# Patient Record
Sex: Female | Born: 1990 | Race: Black or African American | Hispanic: No | Marital: Single | Smoking: Never smoker
Health system: Southern US, Community
[De-identification: ages and names within clinical notes are randomized; demographics above are authoritative.]

## PROBLEM LIST (undated history)

## (undated) ENCOUNTER — Inpatient Hospital Stay (HOSPITAL_COMMUNITY): Payer: Self-pay

## (undated) DIAGNOSIS — B977 Papillomavirus as the cause of diseases classified elsewhere: Secondary | ICD-10-CM

## (undated) DIAGNOSIS — B0089 Other herpesviral infection: Secondary | ICD-10-CM

## (undated) DIAGNOSIS — R87629 Unspecified abnormal cytological findings in specimens from vagina: Secondary | ICD-10-CM

## (undated) DIAGNOSIS — IMO0002 Reserved for concepts with insufficient information to code with codable children: Secondary | ICD-10-CM

## (undated) DIAGNOSIS — B009 Herpesviral infection, unspecified: Secondary | ICD-10-CM

## (undated) DIAGNOSIS — D649 Anemia, unspecified: Secondary | ICD-10-CM

## (undated) HISTORY — DX: Other herpesviral infection: B00.89

## (undated) HISTORY — DX: Anemia, unspecified: D64.9

## (undated) HISTORY — DX: Papillomavirus as the cause of diseases classified elsewhere: B97.7

## (undated) HISTORY — DX: Herpesviral infection, unspecified: B00.9

## (undated) HISTORY — PX: LEEP: SHX91

## (undated) HISTORY — DX: Reserved for concepts with insufficient information to code with codable children: IMO0002

---

## 2011-03-14 ENCOUNTER — Ambulatory Visit (INDEPENDENT_AMBULATORY_CARE_PROVIDER_SITE_OTHER): Payer: Self-pay | Admitting: Obstetrics & Gynecology

## 2011-03-14 ENCOUNTER — Encounter: Payer: Self-pay | Admitting: Obstetrics & Gynecology

## 2011-03-14 DIAGNOSIS — N871 Moderate cervical dysplasia: Secondary | ICD-10-CM

## 2011-03-14 NOTE — Progress Notes (Signed)
20 y.o.G1P0010 No LMP recorded. Patient has had an injection. 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 I discussed cryotherapy vs. LEEP and she accepts LEEP. She will view the video today and she will be scheduled. Her questions were answered.  ARNOLD,JAMES 03/14/11

## 2011-03-14 NOTE — Patient Instructions (Signed)
Loop Electrosurgical Excision Procedure Loop electrosurgical excision procedure (LEEP) is the removal of a portion of the lower part of the uterus (cervix). The procedure is done when there are significantly abnormal cervical cell changes. Abnormal cell changes of the cervix can lead to cancer if left in place and untreated.  The LEEP procedure itself typically only takes a few minutes. Often, it may be done in your caregiver's office. The procedure is considered safe for those who wish to get pregnant or are trying to get pregnant. Only under rare circumstances should this procedure be done if you are pregnant. LET YOUR CAREGIVER KNOW ABOUT:  Whether you are pregnant or late for your last menstrual period.   Allergies to foods or medicines.   All the medicines you are taking includingherbs, eyedrops, and over-the-counter medicines, and creams.   Use of steroids (by mouth or creams).   Previous problems with anesthetics or numbing medicine.   Previous gynecological surgery.   History of blood clots or bleeding problems.   Any recent or current vaginal infections (herpes, sexually transmitted infections).   Other health problems.  RISKS AND COMPLICATIONS  Bleeding.   Infection.   Injury to the vagina, bladder, or rectum.   Very rare obstruction of the cervical opening that causes problems during menstruation (cervical stenosis).  BEFORE THE PROCEDURE  Do not take aspirin or blood thinners (anticoagulants) for 1 week before the procedure, or as told by your caregiver.   Eat a light meal before the procedure.   Ask your caregiver about changing or stopping your regular medicines.   You may be given a pain reliever 1 or 2 hours before the procedure.  PROCEDURE   A tool (speculum) is placed in the vagina. This allows your caregiver to see the cervix.   An iodine stain is applied to the cervix to find the area of abnormal cells to be removed.   Medicine is injected to numb  the cervix (local anesthetic).    Electricity is passed through a thin wire loop which is then used to remove (cauterize) a small segment of the affected cervix.   Light electrocautery is used to seal any small blood vessels and prevent bleeding.   A paste may be applied to the cauterized area of the cervix to help prevent bleeding.   The tissue sample is sent to the lab. It is examined under the microscope.  AFTER THE PROCEDURE  Have someone drive you home.   You may have slight to moderate cramping.   You may notice a black vaginal discharge from the paste used on the cervix to prevent bleeding. This is normal.   Watch for excessive bleeding. This requires immediate medical care.   Ask when your test results will be ready. Make sure you get your test results.  Document Released: 04/07/2002 Document Revised: 09/27/2010 Document Reviewed: 06/27/2010 [employer] Patient Information 2012 [employer]. 

## 2011-04-16 ENCOUNTER — Ambulatory Visit (INDEPENDENT_AMBULATORY_CARE_PROVIDER_SITE_OTHER): Payer: Self-pay | Admitting: Family Medicine

## 2011-04-16 ENCOUNTER — Encounter: Payer: Self-pay | Admitting: Family Medicine

## 2011-04-16 ENCOUNTER — Other Ambulatory Visit (HOSPITAL_COMMUNITY)
Admission: RE | Admit: 2011-04-16 | Discharge: 2011-04-16 | Disposition: A | Discharge status: Home / Self Care | Payer: Self-pay | Source: Ambulatory Visit | Attending: Family Medicine | Admitting: Family Medicine

## 2011-04-16 VITALS — BP 139/87 | HR 103 | Temp 97.4°F | Ht <= 58 in | Wt 105.8 lb

## 2011-04-16 DIAGNOSIS — N871 Moderate cervical dysplasia: Secondary | ICD-10-CM | POA: Insufficient documentation

## 2011-04-16 DIAGNOSIS — Z01812 Encounter for preprocedural laboratory examination: Secondary | ICD-10-CM

## 2011-04-16 LAB — POCT PREGNANCY, URINE: Preg Test, Ur: NEGATIVE

## 2011-04-16 NOTE — Progress Notes (Signed)
  LEEP PROCEDURE NOTE Pap smear and colposcopy reviewed.   Pap ASCUS - HPV+ Colpo Biopsy CIN2 ECC Neg Risks, benefits, alternatives, and limitations of procedure explained to patient, including pain, bleeding, infection, failure to remove abnormal tissue and failure to cure dysplasia, need for repeat procedures, damage to pelvic organs, cervical incompetence.  Role of HPV,cervical dysplasia and need for close followup was empasized. Informed written consent was obtained. All questions were answered. Time out performed.  Procedure: The patient was placed in lithotomy position and the bivalved coated speculum was placed in the patient's vagina. A grounding pad placed on the patient. Lugol's solution was applied to the cervix and areas of decreased uptake were noted 6 o'clock.   Local anesthesia was administered via an intracervical block using 10cc of 2% Lidocaine with epinephrine. The suction was turned on and the Small 1X Fisher Cone Biopsy Excisor on 41 Watts of cutting current was used to excise the area of decreased uptake and excise the entire transformation zone. Excellent hemostasis was achieved using roller ball coagulation set at 50 Watts coagulation current. Monsel's solution was then applied and the speculum was removed from the vagina. Specimens were sent to pathology. The patient tolerated the procedure well. Post-operative instructions given to patient, including instruction to seek medical attention for persistent bright red bleeding, fever, abdominal/pelvic pain, dysuria, nausea or vomiting. She was also told about the possibility of having copious yellow to black tinged discharge. She was counseled to avoid anything in the vagina (sex/douching/tampons) for 4-6 weeks. She has a  4-week post-operative check to review results and assess wound healing. Follow up in 6 months for repeat pap or as needed.

## 2011-04-16 NOTE — Patient Instructions (Signed)
Loop Electrosurgical Excision Procedure Care After Refer to this sheet in the next few weeks. These instructions provide you with information on caring for yourself after your procedure. Your caregiver may also give you more specific instructions. Your treatment has been planned according to current medical practices, but problems sometimes occur. Call your caregiver if you have any problems or questions after your procedure. HOME CARE INSTRUCTIONS   Do not use tampons, douche, or have sexual intercourse for 2 weeks or as directed by your caregiver.   Begin normal activities if you have no or minimal cramping or bleeding, unless directed otherwise by your caregiver.   Take your temperature if you feel sick. Write down your temperature on paper, and tell your caregiver if you have a fever.   Take all medicines as directed by your caregiver.   Keep all your follow-up appointments and Pap tests as directed by your caregiver.  SEEK IMMEDIATE MEDICAL CARE IF:   You have bleeding that is heavier or longer than a normal menstrual cycle.   You have bleeding that is bright red.   You have blood clots.   You have a fever.   You have increasing cramps or pain not relieved by medicine.   You develop abdominal pain that does not seem to be related to the same area of earlier cramping and pain.   You are lightheaded, unusually weak, or faint.   You develop painful or bloody urination.   You develop a bad smelling vaginal discharge.  MAKE SURE YOU:  Understand these instructions.   Will watch your condition.   Will get help right away if you are not doing well or get worse.  Document Released: 09/28/2010 Document Revised: 01/04/2011 Document Reviewed: 09/28/2010 [employer] Patient Information 2012 [employer]. 

## 2011-05-16 ENCOUNTER — Ambulatory Visit (INDEPENDENT_AMBULATORY_CARE_PROVIDER_SITE_OTHER): Payer: Self-pay | Admitting: Family Medicine

## 2011-05-16 VITALS — BP 128/88 | HR 97 | Temp 99.8°F | Ht <= 58 in | Wt 102.0 lb

## 2011-05-16 DIAGNOSIS — N871 Moderate cervical dysplasia: Secondary | ICD-10-CM

## 2011-05-16 NOTE — Progress Notes (Signed)
  Subjective:    Patient ID: Mary Bray, female    DOB: [birth year], 21 y.o.   MRN: [id number]  HPI Patient seen for follow up of LEEP - having no discharge, discomfort, etc.  Having no other problems.  Review of Systems     Objective:   Physical Exam  Constitutional: She is oriented to person, place, and time. She appears well-developed and well-nourished.  Neurological: She is alert and oriented to person, place, and time.  Psychiatric: She has a normal mood and affect. Her behavior is normal. Judgment and thought content normal.      Assessment & Plan:  1.  CIN 2 Follow up with PAP at 6 and 12 months.

## 2011-05-16 NOTE — Patient Instructions (Signed)
The biopsy (LEEP) results show that we removed all of the abnormal cells. You will need a PAP smear at 6 months (Sept 2013) and 12 months (March 2014)

## 2012-04-11 ENCOUNTER — Encounter (HOSPITAL_COMMUNITY): Payer: Self-pay | Admitting: Emergency Medicine

## 2012-04-11 ENCOUNTER — Emergency Department (HOSPITAL_COMMUNITY)
Admission: EM | Admit: 2012-04-11 | Discharge: 2012-04-11 | Disposition: A | Discharge status: Home / Self Care | Payer: No Typology Code available for payment source | Attending: Emergency Medicine | Admitting: Emergency Medicine

## 2012-04-11 DIAGNOSIS — S7010XA Contusion of unspecified thigh, initial encounter: Secondary | ICD-10-CM | POA: Insufficient documentation

## 2012-04-11 DIAGNOSIS — S60222A Contusion of left hand, initial encounter: Secondary | ICD-10-CM

## 2012-04-11 DIAGNOSIS — S60229A Contusion of unspecified hand, initial encounter: Secondary | ICD-10-CM | POA: Insufficient documentation

## 2012-04-11 DIAGNOSIS — Y9389 Activity, other specified: Secondary | ICD-10-CM | POA: Insufficient documentation

## 2012-04-11 DIAGNOSIS — S0003XA Contusion of scalp, initial encounter: Secondary | ICD-10-CM | POA: Insufficient documentation

## 2012-04-11 DIAGNOSIS — S0083XA Contusion of other part of head, initial encounter: Secondary | ICD-10-CM

## 2012-04-11 DIAGNOSIS — Y92410 Unspecified street and highway as the place of occurrence of the external cause: Secondary | ICD-10-CM | POA: Insufficient documentation

## 2012-04-11 DIAGNOSIS — S335XXA Sprain of ligaments of lumbar spine, initial encounter: Secondary | ICD-10-CM | POA: Insufficient documentation

## 2012-04-11 DIAGNOSIS — S7012XA Contusion of left thigh, initial encounter: Secondary | ICD-10-CM

## 2012-04-11 DIAGNOSIS — S39012A Strain of muscle, fascia and tendon of lower back, initial encounter: Secondary | ICD-10-CM

## 2012-04-11 MED ORDER — CYCLOBENZAPRINE HCL 5 MG PO TABS: 5.0000 mg | ORAL_TABLET | Freq: Three times a day (TID) | ORAL | Status: DC | PRN

## 2012-04-11 MED ORDER — CYCLOBENZAPRINE HCL 10 MG PO TABS
5.0000 mg | ORAL_TABLET | Freq: Once | ORAL | Status: AC
  Administered 2012-04-11: 5 mg via ORAL
  Filled 2012-04-11: qty 1

## 2012-04-11 MED ORDER — ACETAMINOPHEN-CODEINE #3 300-30 MG PO TABS: 1.0000 | ORAL_TABLET | Freq: Four times a day (QID) | ORAL | Status: DC | PRN

## 2012-04-11 NOTE — Discharge Instructions (Signed)
 Contusion A contusion is a deep bruise. Contusions are the result of an injury that caused bleeding under the skin. The contusion may turn blue, purple, or yellow. Minor injuries will give you a painless contusion, but more severe contusions may stay painful and swollen for a few weeks.  CAUSES  A contusion is usually caused by a blow, trauma, or direct force to an area of the body. SYMPTOMS   Swelling and redness of the injured area.  Bruising of the injured area.  Tenderness and soreness of the injured area.  Pain. DIAGNOSIS  The diagnosis can be made by taking a history and physical exam. An X-ray, CT scan, or MRI may be needed to determine if there were any associated injuries, such as fractures. TREATMENT  Specific treatment will depend on what area of the body was injured. In general, the best treatment for a contusion is resting, icing, elevating, and applying cold compresses to the injured area. Over-the-counter medicines may also be recommended for pain control. Ask your caregiver what the best treatment is for your contusion. HOME CARE INSTRUCTIONS   Put ice on the injured area.  Put ice in a plastic bag.  Place a towel between your skin and the bag.  Leave the ice on for 15 to 20 minutes, 3 to 4 times a day.  Only take over-the-counter or prescription medicines for pain, discomfort, or fever as directed by your caregiver. Your caregiver may recommend avoiding anti-inflammatory medicines (aspirin, ibuprofen , and naproxen) for 48 hours because these medicines may increase bruising.  Rest the injured area.  If possible, elevate the injured area to reduce swelling. SEEK IMMEDIATE MEDICAL CARE IF:   You have increased bruising or swelling.  You have pain that is getting worse.  Your swelling or pain is not relieved with medicines. MAKE SURE YOU:   Understand these instructions.  Will watch your condition.  Will get help right away if you are not doing well or get  worse. Document Released: 10/25/2004 Document Revised: 04/09/2011 Document Reviewed: 11/20/2010 [facility] Patient Information 2013 [place], [place].   Narcotic and benzodiazepine use may cause drowsiness, slowed breathing or dependence.  Please use with caution and do not drive, operate machinery or watch young children alone while taking them.  Taking combinations of these medications or drinking alcohol will potentiate these effects.
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
## 2012-04-11 NOTE — ED Notes (Signed)
AIDET performed. 

## 2013-11-30 ENCOUNTER — Encounter (HOSPITAL_COMMUNITY): Payer: Self-pay | Admitting: Emergency Medicine

## 2016-08-27 ENCOUNTER — Ambulatory Visit (INDEPENDENT_AMBULATORY_CARE_PROVIDER_SITE_OTHER): Payer: Self-pay | Admitting: Orthopaedic Surgery

## 2017-04-13 ENCOUNTER — Emergency Department (HOSPITAL_COMMUNITY): Payer: Medicaid Other

## 2017-04-13 ENCOUNTER — Emergency Department (HOSPITAL_COMMUNITY)
Admission: EM | Admit: 2017-04-13 | Discharge: 2017-04-13 | Disposition: A | Discharge status: Home / Self Care | Payer: Medicaid Other | Attending: Physician Assistant | Admitting: Physician Assistant

## 2017-04-13 ENCOUNTER — Encounter (HOSPITAL_COMMUNITY): Payer: Self-pay

## 2017-04-13 ENCOUNTER — Other Ambulatory Visit: Payer: Self-pay

## 2017-04-13 DIAGNOSIS — Z041 Encounter for examination and observation following transport accident: Secondary | ICD-10-CM | POA: Diagnosis not present

## 2017-04-13 LAB — HCG, QUANTITATIVE, PREGNANCY: HCG, BETA CHAIN, QUANT, S: 1915 m[IU]/mL — AB (ref <5)

## 2017-04-13 NOTE — ED Notes (Signed)
From ul

## 2017-04-13 NOTE — Discharge Instructions (Signed)
Follow-up with OB/GYN as needed for prenatal care or any other concerns about your pregnancy. Return to the emergency room if you develop any new or concerning symptoms.
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
## 2017-04-13 NOTE — ED Notes (Signed)
To ultrasound

## 2017-05-06 ENCOUNTER — Inpatient Hospital Stay (HOSPITAL_COMMUNITY)
Admission: AD | Admit: 2017-05-06 | Discharge: 2017-05-07 | Disposition: A | Discharge status: Home / Self Care | Payer: Medicaid Other | Source: Ambulatory Visit | Attending: Obstetrics and Gynecology | Admitting: Obstetrics and Gynecology

## 2017-05-06 ENCOUNTER — Encounter (HOSPITAL_COMMUNITY): Payer: Self-pay

## 2017-05-06 DIAGNOSIS — Z3A01 Less than 8 weeks gestation of pregnancy: Secondary | ICD-10-CM | POA: Insufficient documentation

## 2017-05-06 DIAGNOSIS — O26851 Spotting complicating pregnancy, first trimester: Secondary | ICD-10-CM | POA: Insufficient documentation

## 2017-05-06 DIAGNOSIS — O209 Hemorrhage in early pregnancy, unspecified: Secondary | ICD-10-CM

## 2017-05-06 HISTORY — DX: Unspecified abnormal cytological findings in specimens from vagina: R87.629

## 2017-05-06 NOTE — MAU Note (Signed)
Pt here with spotting that started tonight. Had intercourse last night. Denies pain.

## 2017-05-07 ENCOUNTER — Encounter (HOSPITAL_COMMUNITY): Payer: Self-pay | Admitting: *Deleted

## 2017-05-07 ENCOUNTER — Inpatient Hospital Stay (HOSPITAL_COMMUNITY): Payer: Medicaid Other

## 2017-05-07 LAB — URINALYSIS, ROUTINE W REFLEX MICROSCOPIC
Bilirubin Urine: NEGATIVE
Glucose, UA: NEGATIVE mg/dL
Hgb urine dipstick: NEGATIVE
Ketones, ur: NEGATIVE mg/dL
Leukocytes, UA: NEGATIVE
Nitrite: NEGATIVE
Protein, ur: NEGATIVE mg/dL
Specific Gravity, Urine: 1.031 — ABNORMAL HIGH (ref 1.005–1.030)
pH: 5 (ref 5.0–8.0)

## 2017-05-07 LAB — WET PREP, GENITAL
Sperm: NONE SEEN
Trich, Wet Prep: NONE SEEN
Yeast Wet Prep HPF POC: NONE SEEN

## 2017-05-07 NOTE — MAU Note (Signed)
Explained delay to pt.  She said she has to get home so she can sleep before work . Offered to provide her with a work note but she said she is not to use a note, she has to work . L Clemmons CNM on her way to evaluate pt.

## 2017-05-07 NOTE — MAU Note (Signed)
Pt states she voided around 2200 and noticed pink spotting and got nervous and decided to come in to be checked.

## 2017-05-07 NOTE — MAU Provider Note (Signed)
History     CSN: [id number]  Arrival date and time: 05/06/17 2212   None     Chief Complaint  Patient presents with  . Vaginal Bleeding   Pt states she voided around 2200 and noticed pink spotting and got nervous and decided to come in to be checked. Explained delay to pt.  She said she has to get home so she can sleep before work . Offered to provide her with a work note but she said she is not to use a note, she has to work . L Clemmons CNM on her way to evaluate pt.     Past Medical History:  Diagnosis Date  . Abnormal Pap smear   . Vaginal Pap smear, abnormal     Past Surgical History:  Procedure Laterality Date  . LEEP      History reviewed. No pertinent family history.  Social History   Tobacco Use  . Smoking status: Never Smoker  . Smokeless tobacco: Never Used  Substance Use Topics  . Alcohol use: Yes    Comment: not while preg  . Drug use: No    Allergies: No Known Allergies  Medications Prior to Admission  Medication Sig Dispense Refill Last Dose  . Prenatal Vit-Fe Fumarate-FA (MULTIVITAMIN-PRENATAL) 27-0.8 MG TABS tablet Take 1 tablet by mouth daily at 12 noon.   05/06/2017 at Unknown time    Review of Systems  Genitourinary:       Pink spotting after sex   Physical Exam   Blood pressure 132/72, pulse 96, temperature 98.5 F (36.9 C), temperature source Oral, resp. rate 18, height 4\' 9"  (1.448 m), weight 103 lb (46.7 kg), SpO2 100 %. Results for orders placed or performed during the hospital encounter of 05/06/17 (from the past 24 hour(s))  Urinalysis, Routine w reflex microscopic     Status: Abnormal   Collection Time: 05/06/17 11:25 PM  Result Value Ref Range   Color, Urine YELLOW YELLOW   APPearance HAZY (A) CLEAR   Specific Gravity, Urine 1.031 (H) 1.005 - 1.030   pH 5.0 5.0 - 8.0   Glucose, UA NEGATIVE NEGATIVE mg/dL   Hgb urine dipstick NEGATIVE NEGATIVE   Bilirubin Urine NEGATIVE NEGATIVE   Ketones, ur NEGATIVE NEGATIVE mg/dL   Protein, ur NEGATIVE NEGATIVE mg/dL   Nitrite NEGATIVE NEGATIVE   Leukocytes, UA NEGATIVE NEGATIVE  Wet prep, genital     Status: Abnormal   Collection Time: 05/07/17 12:43 AM  Result Value Ref Range   Yeast Wet Prep HPF POC NONE SEEN NONE SEEN   Trich, Wet Prep NONE SEEN NONE SEEN   Clue Cells Wet Prep HPF POC PRESENT (A) NONE SEEN   WBC, Wet Prep HPF POC FEW (A) NONE SEEN   Sperm NONE SEEN    Physical Exam  Nursing note and vitals reviewed. Constitutional: She is oriented to person, place, and time. She appears well-developed.  HENT:  Head: Normocephalic.  Cardiovascular: Normal rate.  Respiratory: Effort normal.  GI: Soft.  Musculoskeletal: Normal range of motion.  Neurological: She is alert and oriented to person, place, and time.  Psychiatric: She has a normal mood and affect.   [place] Ob Transvaginal  Result Date: 05/07/2017 CLINICAL DATA:  Pregnant patient in first-trimester pregnancy with vaginal bleeding. EXAM: OBSTETRIC <14 WK ULTRASOUND TECHNIQUE: Transabdominal ultrasound was performed for evaluation of the gestation as well as the maternal uterus and adnexal regions. COMPARISON:  Obstetric ultrasound 04/13/2017 FINDINGS: Intrauterine gestational sac: Single Yolk sac:  Not Visualized. Embryo:  Not Visualized. Cardiac Activity: Not Visualized. MSD: No 0.5 mm   5 w   5 d Subchorionic hemorrhage:  None visualized. Maternal uterus/adnexae: Gestational sac in the endometrial canal. Yolk sac on prior exam is not currently visualized. Some internal debris in the gestational sac. The previous ovarian cysts have resolved, the ovaries are currently normal in size. No pelvic free fluid. IMPRESSION: 1. Intrauterine gestational sac, the yolk sac seen previously is no longer visualized. Absence of an embryo greater than 10 days after scan demonstrating a gestational sac and yolk sac. Findings meet definitive criteria for failed pregnancy. This follows SRU consensus guidelines: Diagnostic Criteria  for Nonviable Pregnancy Early in the First Trimester. Macy Mis J Med [id number]. 2. Previous ovarian cysts have resolved. Electronically Signed   By: Rubye Oaks M.D.   On: 05/07/2017 02:30   MAU Course  Procedures  MDM Pt sent to Ultrasound and then stated she could not wait for results' She wants to be called with results" to make sure her baby is ok" Pt is dressed and leaving. Declines further treatment  Assessment and Plan  First Trimester Spotting  Pt left without results; NO PHYSICAL compalints when she left.  05/07/17 908pm- Called pt and informed her of u/s results. She has appointment with Henreitta Leber tomorrow. Encouraged pt to keep ap[pointment and explained we would need to follow beta HCG's. Pt states she understands but was upset and crying on the phone.  Lori A Clemmons 05/07/2017, 1:57 AM

## 2017-10-27 ENCOUNTER — Encounter (HOSPITAL_COMMUNITY): Payer: Self-pay | Admitting: Emergency Medicine

## 2017-10-27 ENCOUNTER — Emergency Department (HOSPITAL_COMMUNITY)
Admission: EM | Admit: 2017-10-27 | Discharge: 2017-10-27 | Disposition: A | Discharge status: Home / Self Care | Payer: BLUE CROSS/BLUE SHIELD | Attending: Emergency Medicine | Admitting: Emergency Medicine

## 2017-10-27 ENCOUNTER — Other Ambulatory Visit: Payer: Self-pay

## 2017-10-27 ENCOUNTER — Emergency Department (HOSPITAL_COMMUNITY): Payer: BLUE CROSS/BLUE SHIELD

## 2017-10-27 DIAGNOSIS — S161XXA Strain of muscle, fascia and tendon at neck level, initial encounter: Secondary | ICD-10-CM | POA: Diagnosis not present

## 2017-10-27 DIAGNOSIS — Y939 Activity, unspecified: Secondary | ICD-10-CM | POA: Insufficient documentation

## 2017-10-27 DIAGNOSIS — M542 Cervicalgia: Secondary | ICD-10-CM

## 2017-10-27 DIAGNOSIS — S199XXA Unspecified injury of neck, initial encounter: Secondary | ICD-10-CM | POA: Diagnosis present

## 2017-10-27 DIAGNOSIS — Y92410 Unspecified street and highway as the place of occurrence of the external cause: Secondary | ICD-10-CM | POA: Insufficient documentation

## 2017-10-27 DIAGNOSIS — Y998 Other external cause status: Secondary | ICD-10-CM | POA: Diagnosis not present

## 2017-10-27 LAB — POC URINE PREG, ED: PREG TEST UR: NEGATIVE

## 2017-10-27 MED ORDER — IBUPROFEN 600 MG PO TABS: 600.0000 mg | ORAL_TABLET | Freq: Four times a day (QID) | ORAL | 0 refills | Status: DC | PRN

## 2017-10-27 MED ORDER — METHOCARBAMOL 500 MG PO TABS: 500.0000 mg | ORAL_TABLET | Freq: Every evening | ORAL | 0 refills | Status: DC | PRN

## 2017-10-27 MED ORDER — IBUPROFEN 400 MG PO TABS
600.0000 mg | ORAL_TABLET | Freq: Once | ORAL | Status: AC
  Administered 2017-10-27: 23:00:00 600 mg via ORAL
  Filled 2017-10-27: qty 1

## 2017-10-27 MED ORDER — METHOCARBAMOL 500 MG PO TABS
500.0000 mg | ORAL_TABLET | Freq: Once | ORAL | Status: AC
  Administered 2017-10-27: 500 mg via ORAL
  Filled 2017-10-27: qty 1
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
## 2017-10-27 NOTE — ED Notes (Addendum)
Pt left before receiving discharge paperwork or discharge vitals could be completed. Also not able to review discharge instructions with pt.
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
## 2018-09-03 ENCOUNTER — Other Ambulatory Visit: Payer: Self-pay

## 2018-09-03 ENCOUNTER — Ambulatory Visit (INDEPENDENT_AMBULATORY_CARE_PROVIDER_SITE_OTHER): Payer: BLUE CROSS/BLUE SHIELD | Admitting: Registered Nurse

## 2018-09-03 ENCOUNTER — Encounter: Payer: Self-pay | Admitting: Registered Nurse

## 2018-09-03 VITALS — BP 127/84 | HR 61 | Temp 99.0°F | Resp 16 | Ht 60.83 in | Wt 99.4 lb

## 2018-09-03 DIAGNOSIS — Z13228 Encounter for screening for other metabolic disorders: Secondary | ICD-10-CM | POA: Diagnosis not present

## 2018-09-03 DIAGNOSIS — Z1329 Encounter for screening for other suspected endocrine disorder: Secondary | ICD-10-CM

## 2018-09-03 DIAGNOSIS — M545 Low back pain, unspecified: Secondary | ICD-10-CM

## 2018-09-03 DIAGNOSIS — Z13220 Encounter for screening for lipoid disorders: Secondary | ICD-10-CM

## 2018-09-03 DIAGNOSIS — Z130 Encounter for screening for diseases of the blood and blood-forming organs and certain disorders involving the immune mechanism: Secondary | ICD-10-CM

## 2018-09-03 DIAGNOSIS — G8929 Other chronic pain: Secondary | ICD-10-CM

## 2018-09-03 NOTE — Patient Instructions (Signed)
° ° ° °  If you have lab work done today you will be contacted with your lab results within the next 2 weeks.  If you have not heard from us then please contact us. The fastest way to get your results is to register for My Chart. ° ° °IF you received an x-ray today, you will receive an invoice from [facility]. Please contact [facility] at [phone] with questions or concerns regarding your invoice.  ° °IF you received labwork today, you will receive an invoice from [facility]. Please contact [facility] at [phone] with questions or concerns regarding your invoice.  ° °Our billing staff will not be able to assist you with questions regarding bills from these companies. ° °You will be contacted with the lab results as soon as they are available. The fastest way to get your results is to activate your My Chart account. Instructions are located on the last page of this paperwork. If you have not heard from us regarding the results in 2 weeks, please contact this office. °  ° ° ° °
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
## 2018-09-04 LAB — COMPREHENSIVE METABOLIC PANEL
ALT: 6 IU/L (ref 0–32)
AST: 16 IU/L (ref 0–40)
Albumin/Globulin Ratio: 1.6 (ref 1.2–2.2)
Albumin: 4.1 g/dL (ref 3.9–5.0)
Alkaline Phosphatase: 70 IU/L (ref 39–117)
BUN/Creatinine Ratio: 14 (ref 9–23)
BUN: 11 mg/dL (ref 6–20)
Bilirubin Total: 0.4 mg/dL (ref 0.0–1.2)
CO2: 23 mmol/L (ref 20–29)
Calcium: 9.3 mg/dL (ref 8.7–10.2)
Chloride: 105 mmol/L (ref 96–106)
Creatinine, Ser: 0.77 mg/dL (ref 0.57–1.00)
GFR calc Af Amer: 121 mL/min/{1.73_m2} (ref >59)
GFR calc non Af Amer: 105 mL/min/{1.73_m2} (ref >59)
Globulin, Total: 2.6 g/dL (ref 1.5–4.5)
Glucose: 83 mg/dL (ref 65–99)
Potassium: 3.8 mmol/L (ref 3.5–5.2)
Sodium: 142 mmol/L (ref 134–144)
Total Protein: 6.7 g/dL (ref 6.0–8.5)

## 2018-09-04 LAB — CBC WITH DIFFERENTIAL/PLATELET
Basophils Absolute: 0 10*3/uL (ref 0.0–0.2)
Basos: 1 %
EOS (ABSOLUTE): 0.1 10*3/uL (ref 0.0–0.4)
Eos: 2 %
Hematocrit: 38.5 % (ref 34.0–46.6)
Hemoglobin: 11.9 g/dL (ref 11.1–15.9)
Immature Grans (Abs): 0 10*3/uL (ref 0.0–0.1)
Immature Granulocytes: 0 %
Lymphocytes Absolute: 1.6 10*3/uL (ref 0.7–3.1)
Lymphs: 43 %
MCH: 27.5 pg (ref 26.6–33.0)
MCHC: 30.9 g/dL — ABNORMAL LOW (ref 31.5–35.7)
MCV: 89 fL (ref 79–97)
Monocytes Absolute: 0.3 10*3/uL (ref 0.1–0.9)
Monocytes: 8 %
Neutrophils Absolute: 1.7 10*3/uL (ref 1.4–7.0)
Neutrophils: 46 %
Platelets: 245 10*3/uL (ref 150–450)
RBC: 4.33 x10E6/uL (ref 3.77–5.28)
RDW: 13.1 % (ref 11.7–15.4)
WBC: 3.7 10*3/uL (ref 3.4–10.8)

## 2018-09-04 LAB — LIPID PANEL
Chol/HDL Ratio: 2.1 ratio (ref 0.0–4.4)
Cholesterol, Total: 133 mg/dL (ref 100–199)
HDL: 63 mg/dL (ref >39)
LDL Calculated: 59 mg/dL (ref 0–99)
Triglycerides: 54 mg/dL (ref 0–149)
VLDL Cholesterol Cal: 11 mg/dL (ref 5–40)

## 2018-09-04 LAB — TSH: TSH: 0.916 u[IU]/mL (ref 0.450–4.500)

## 2018-09-04 LAB — HEMOGLOBIN A1C
Est. average glucose Bld gHb Est-mCnc: 105 mg/dL
Hgb A1c MFr Bld: 5.3 % (ref 4.8–5.6)

## 2018-09-05 ENCOUNTER — Encounter: Payer: Self-pay | Admitting: Registered Nurse

## 2018-09-05 NOTE — Progress Notes (Signed)
Lab results normal except for mildly decreased MCHC, not concerning at this time.  Letter sent.  Kathrin Ruddy, NP

## 2018-09-12 ENCOUNTER — Emergency Department (HOSPITAL_COMMUNITY)
Admission: EM | Admit: 2018-09-12 | Discharge: 2018-09-12 | Disposition: A | Discharge status: Home / Self Care | Payer: BC Managed Care – PPO | Attending: Emergency Medicine | Admitting: Emergency Medicine

## 2018-09-12 ENCOUNTER — Other Ambulatory Visit: Payer: Self-pay

## 2018-09-12 ENCOUNTER — Encounter (HOSPITAL_COMMUNITY): Payer: Self-pay | Admitting: *Deleted

## 2018-09-12 DIAGNOSIS — K5900 Constipation, unspecified: Secondary | ICD-10-CM | POA: Diagnosis not present

## 2018-09-12 DIAGNOSIS — R1030 Lower abdominal pain, unspecified: Secondary | ICD-10-CM

## 2018-09-12 DIAGNOSIS — Z79899 Other long term (current) drug therapy: Secondary | ICD-10-CM | POA: Diagnosis not present

## 2018-09-12 LAB — COMPREHENSIVE METABOLIC PANEL
ALT: 10 U/L (ref 0–44)
AST: 19 U/L (ref 15–41)
Albumin: 4 g/dL (ref 3.5–5.0)
Alkaline Phosphatase: 77 U/L (ref 38–126)
Anion gap: 8 (ref 5–15)
BUN: 12 mg/dL (ref 6–20)
CO2: 26 mmol/L (ref 22–32)
Calcium: 9.5 mg/dL (ref 8.9–10.3)
Chloride: 106 mmol/L (ref 98–111)
Creatinine, Ser: 0.74 mg/dL (ref 0.44–1.00)
GFR calc Af Amer: >60 mL/min (ref >60)
GFR calc non Af Amer: >60 mL/min (ref >60)
Glucose, Bld: 96 mg/dL (ref 70–99)
Potassium: 3.6 mmol/L (ref 3.5–5.1)
Sodium: 140 mmol/L (ref 135–145)
Total Bilirubin: 0.7 mg/dL (ref 0.3–1.2)
Total Protein: 7.3 g/dL (ref 6.5–8.1)

## 2018-09-12 LAB — URINALYSIS, ROUTINE W REFLEX MICROSCOPIC
Bilirubin Urine: NEGATIVE
Glucose, UA: NEGATIVE mg/dL
Hgb urine dipstick: NEGATIVE
Ketones, ur: NEGATIVE mg/dL
Leukocytes,Ua: NEGATIVE
Nitrite: NEGATIVE
Protein, ur: NEGATIVE mg/dL
Specific Gravity, Urine: 1.025 (ref 1.005–1.030)
pH: 5 (ref 5.0–8.0)

## 2018-09-12 LAB — CBC
HCT: 37.9 % (ref 36.0–46.0)
Hemoglobin: 12.1 g/dL (ref 12.0–15.0)
MCH: 27.8 pg (ref 26.0–34.0)
MCHC: 31.9 g/dL (ref 30.0–36.0)
MCV: 86.9 fL (ref 80.0–100.0)
Platelets: 231 10*3/uL (ref 150–400)
RBC: 4.36 MIL/uL (ref 3.87–5.11)
RDW: 13 % (ref 11.5–15.5)
WBC: 6.4 10*3/uL (ref 4.0–10.5)
nRBC: 0 % (ref 0.0–0.2)

## 2018-09-12 LAB — POC URINE PREG, ED: Preg Test, Ur: NEGATIVE

## 2018-09-12 MED ORDER — POLYETHYLENE GLYCOL 3350 17 GM/SCOOP PO POWD: ORAL | 0 refills | Status: DC

## 2018-09-12 MED ORDER — SODIUM CHLORIDE 0.9% FLUSH: 3.0000 mL | Freq: Once | INTRAVENOUS | Status: DC

## 2018-09-12 MED ORDER — DICYCLOMINE HCL 20 MG PO TABS: 20.0000 mg | ORAL_TABLET | Freq: Two times a day (BID) | ORAL | 0 refills | Status: DC | PRN

## 2018-09-12 NOTE — ED Triage Notes (Signed)
Pt woke up with mid lower abd pain. No NVD. Pain is intermittent

## 2018-09-12 NOTE — ED Notes (Signed)
Patient verbalizes understanding of discharge instructions. Opportunity for questioning and answers were provided. Armband removed by staff, pt discharged from ED.  

## 2018-09-12 NOTE — Discharge Instructions (Signed)
Make sure you are staying well hydrated with water.  Increase the fibre in your diet to help with constipation.  Use miralax as needed for constipation.  Use bentyl as needed for abdominal cramping.  Follow up with your primary care doctor if your symptoms are not improving.  Return to the ER if you develop fevers, chills, vomiting, severe/persistent pain, inability to urinate, or with any new, worsening, or concerning symptoms.

## 2018-09-12 NOTE — ED Provider Notes (Signed)
[facility]   CSN: [id number] Arrival date & time: 09/12/18  [id number]     History   Chief Complaint Chief Complaint  Patient presents with  . Abdominal Pain    HPI Mary Bray is a 28 y.o. female presenting for evaluation of abdominal pain.  Patient states last night she started to develop abdominal pain.  When she woke up this morning, it was more severe, especially when going towards the bathroom.  Since she first woke up, pain has improved, but has not completely resolved.  Pain is of her lower abdomen, equal both sides and in the middle.  She has not taken anything for her pain including Tylenol or ibuprofen.  She denies fevers, chills, nausea, vomiting, urinary symptoms.  Patient states that she has not pooped in several days, she cannot remember the last time.  She normally goes several days in between bowel movements, sometimes needs to strain.  She states she has not had anything to eat since yesterday afternoon, during which she had some cheese sticks and sweet tea.  She denies vaginal discharge or vaginal bleeding.  Last period was 2 weeks ago, it was normal for her.  She denies sick contacts.     HPI  Past Medical History:  Diagnosis Date  . Abnormal Pap smear   . Vaginal Pap smear, abnormal     Patient Active Problem List   Diagnosis Date Noted  . CIN II (cervical intraepithelial neoplasia II) 03/14/2011    Past Surgical History:  Procedure Laterality Date  . LEEP       OB History    Gravida  2   Para      Term      Preterm      AB  1   Living  0     SAB      TAB  1   Ectopic      Multiple      Live Births               Home Medications    Prior to Admission medications   Medication Sig Start Date End Date Taking? Authorizing Provider  ibuprofen (ADVIL,MOTRIN) 600 MG tablet Take 1 tablet (600 mg total) by mouth every 6 (six) hours as needed. 10/27/17  Yes Maczis, Elmer SowMichael M, PA-C   Prenatal Vit-Fe Fumarate-FA (MULTIVITAMIN-PRENATAL) 27-0.8 MG TABS tablet Take 1 tablet by mouth daily at 12 noon.   Yes [provider]  valACYclovir (VALTREX) 500 MG tablet Take 500 mg by mouth daily as needed (for sore outbreaks).    Yes [provider]  dicyclomine (BENTYL) 20 MG tablet Take 1 tablet (20 mg total) by mouth 2 (two) times daily as needed for spasms. 09/12/18   Angely Dietz, PA-C  polyethylene glycol powder (MIRALAX) 17 GM/SCOOP powder Use 1-2 capfuls daily dissolved in 8 oz liquid as needed to encourage a bowel movement. 09/12/18   Shelbey Spindler, PA-C    Family History No family history on file.  Social History Social History   Tobacco Use  . Smoking status: Never Smoker  . Smokeless tobacco: Never Used  Substance Use Topics  . Alcohol use: Yes    Comment: occ  . Drug use: No     Allergies   Patient has no known allergies.   Review of Systems Review of Systems  Gastrointestinal: Positive for abdominal pain.  All other systems reviewed and are negative.    Physical Exam Updated  Vital Signs BP (!) 134/99 (BP Location: Right Arm)   Pulse 85   Temp 98.2 F (36.8 C) (Oral)   Resp 12   LMP 08/30/2018   SpO2 100%   Physical Exam Vitals signs and nursing note reviewed.  Constitutional:      General: She is not in acute distress.    Appearance: She is well-developed.     Comments: Sitting comfortably in the chair no acute distress  HENT:     Head: Normocephalic and atraumatic.  Eyes:     Conjunctiva/sclera: Conjunctivae normal.     Pupils: Pupils are equal, round, and reactive to light.  Neck:     Musculoskeletal: Normal range of motion and neck supple.  Cardiovascular:     Rate and Rhythm: Normal rate and regular rhythm.     Pulses: Normal pulses.  Pulmonary:     Effort: Pulmonary effort is normal. No respiratory distress.     Breath sounds: Normal breath sounds. No wheezing.  Abdominal:     General: There is no  distension.     Palpations: Abdomen is soft. There is no mass.     Tenderness: There is abdominal tenderness. There is no guarding or rebound.     Comments: Mild tenderness palpation of lower abdomen.  No tenderness palpation elsewhere in the abdomen. No rigidity, guarding, distention.  Negative rebound.  No signs of peritonitis.  Musculoskeletal: Normal range of motion.  Skin:    General: Skin is warm and dry.     Capillary Refill: Capillary refill takes less than 2 seconds.  Neurological:     Mental Status: She is alert and oriented to person, place, and time.      ED Treatments / Results  Labs (all labs ordered are listed, but only abnormal results are displayed) Labs Reviewed  URINALYSIS, ROUTINE W REFLEX MICROSCOPIC - Abnormal; Notable for the following components:      Result Value   APPearance HAZY (*)    All other components within normal limits  COMPREHENSIVE METABOLIC PANEL  CBC  POC URINE PREG, ED    EKG None  Radiology No results found.  Procedures Procedures (including critical care time)  Medications Ordered in ED Medications  sodium chloride flush (NS) 0.9 % injection 3 mL (has no administration in time range)     Initial Impression / Assessment and Plan / ED Course  I have reviewed the triage vital signs and the nursing notes.  Pertinent labs & imaging results that were available during my care of the patient were reviewed by me and considered in my medical decision making (see chart for details).        Patient presenting for evaluation of lower abdominal pain.  Physical examination, she appears nontoxic.  Pain has been improving without intervention.  Mild tenderness on exam.  Patient also reporting constipation.  No fevers, chills, nausea, vomiting, or other infectious symptoms.  No GU symptoms such as discharge.  Likely constipation versus viral illness.  Low suspicion for appendicitis, as pain is bilateral and without fevers or vomiting.  Low  suspicion for diverticulitis.  Low suspicion for torsion, TOA, PID.  Consider UTI due to lower abdominal pain.  Will obtain labs, urine, and reassess.  Labs reassuring.  No leukocytosis.  Kidney and liver function reassuring.  Electrolytes stable.  Urine without infection.  At this time, doubt intra-abdominal infection, perforation, or surgical abdomen.  I do not believe CT scan is necessary at this time.  Favor constipation.  Discussed  findings with patient.  Discussed symptomatic treatment with increased hydration, increased fiber intake, and MiraLAX as needed.  If pain is not improving with bowel movements, consider follow-up with PCP or in the ED.  Strict return precautions given.  At this time, patient appears safe for discharge.  Patient states she understands and agrees to plan.  Final Clinical Impressions(s) / ED Diagnoses   Final diagnoses:  Lower abdominal pain  Constipation, unspecified constipation type    ED Discharge Orders         Ordered    polyethylene glycol powder (MIRALAX) 17 GM/SCOOP powder     09/12/18 1003    dicyclomine (BENTYL) 20 MG tablet  2 times daily PRN     09/12/18 1003           Kele Withem, PA-C 09/12/18 1222    Terrilee FilesButler, Michael C, MD 09/13/18 [id number]

## 2018-09-29 ENCOUNTER — Ambulatory Visit: Payer: BLUE CROSS/BLUE SHIELD | Admitting: Emergency Medicine

## 2018-11-05 ENCOUNTER — Other Ambulatory Visit: Payer: Self-pay

## 2018-11-05 ENCOUNTER — Encounter: Payer: Self-pay | Admitting: Physical Therapy

## 2018-11-05 ENCOUNTER — Ambulatory Visit: Payer: BC Managed Care – PPO | Attending: Registered Nurse | Admitting: Physical Therapy

## 2018-11-05 DIAGNOSIS — G8929 Other chronic pain: Secondary | ICD-10-CM

## 2018-11-05 DIAGNOSIS — M545 Low back pain, unspecified: Secondary | ICD-10-CM
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
## 2018-11-05 NOTE — Addendum Note (Signed)
Addended by: Elsie Ra R on: 11/05/2018 11:25 AM   Modules accepted: Orders

## 2018-11-05 NOTE — Patient Instructions (Addendum)
TENS UNIT: This is helpful for muscle pain and spasm.   Search and Purchase a TENS 7000 2nd edition at www.tenspros.com. It should be less than $30.     TENS unit instructions: Do not shower or bathe with the unit on Turn the unit off before removing electrodes or batteries If the electrodes lose stickiness add a drop of water to the electrodes after they are disconnected from the unit and place on plastic sheet. If you continued to have difficulty, call the TENS unit company to purchase more electrodes. Do not apply lotion on the skin area prior to use. Make sure the skin is clean and dry as this will help prolong the life of the electrodes. After use, always check skin for unusual red areas, rash or other skin difficulties. If there are any skin problems, does not apply electrodes to the same area. Never remove the electrodes from the unit by pulling the wires. Do not use the TENS unit or electrodes other than as directed. Do not change electrode placement without consultating your therapist or physician. Keep 2 fingers with between each electrode. Wear time ratio is 2:1, on to off times.    For example on for 30 minutes off for 15 minutes and then on for 30 minutes off for 15 minutes  Access Code: [email or web address]  URL: [email or web address]  Date: 11/05/2018  Prepared by: Elsie Ra   Exercises  Supine Single Knee to Chest - 3 sets - 30 hold - 2x daily - 6x weekly  Supine Lower Trunk Rotation - 10 reps - 1-2 sets - 5 hold - 2x daily - 6x weekly  Supine Bridge - 10 reps - 1-2 sets - 5 hold - 2x daily - 6x weekly  Child's Pose Stretch - 3 sets - 30 hold - 2x daily - 6x weekly  Hip Flexor Stretch with Chair - 3 sets - 30 hold - 2x daily - 6x weekly  Standing Pelvic Tilt - 10 reps - 3 sets - 2x daily - 6x weekly

## 2018-12-19 LAB — OB RESULTS CONSOLE ABO/RH: RH Type: POSITIVE

## 2018-12-19 LAB — OB RESULTS CONSOLE GC/CHLAMYDIA
Chlamydia: NEGATIVE
Gonorrhea: NEGATIVE

## 2018-12-19 LAB — OB RESULTS CONSOLE RUBELLA ANTIBODY, IGM: Rubella: IMMUNE

## 2018-12-19 LAB — OB RESULTS CONSOLE HIV ANTIBODY (ROUTINE TESTING): HIV: NONREACTIVE

## 2018-12-19 LAB — OB RESULTS CONSOLE HEPATITIS B SURFACE ANTIGEN: Hepatitis B Surface Ag: NEGATIVE

## 2018-12-19 LAB — OB RESULTS CONSOLE RPR: RPR: NONREACTIVE

## 2018-12-19 LAB — OB RESULTS CONSOLE ANTIBODY SCREEN: Antibody Screen: NEGATIVE

## 2018-12-26 ENCOUNTER — Encounter: Payer: Self-pay | Admitting: Physical Therapy

## 2018-12-26 ENCOUNTER — Encounter: Payer: Self-pay | Admitting: Registered Nurse

## 2018-12-26 NOTE — Telephone Encounter (Signed)
Copied from [place] #[id number]. Topic: General - Other >> Dec 26, 2018  3:48 PM Alanda Slim E wrote: Reason for CRM: Pt needs a dr note f=to clear her to return to work and will upload her results to mychart/ please advise when received

## 2018-12-27 ENCOUNTER — Telehealth: Payer: BC Managed Care – PPO

## 2018-12-29 ENCOUNTER — Encounter: Payer: Self-pay | Admitting: Registered Nurse

## 2018-12-29 ENCOUNTER — Telehealth: Payer: Self-pay | Admitting: Registered Nurse

## 2018-12-29 NOTE — Progress Notes (Signed)
Return to work note sent via [place], NP

## 2018-12-29 NOTE — Telephone Encounter (Signed)
[employer] faxed over forms for disability that was denied due to lack of information. Additional forms were sent for completion. Pprwrk placed in provider's box at nurses station

## 2019-01-02 ENCOUNTER — Telehealth: Payer: Self-pay | Admitting: *Deleted

## 2019-01-02 NOTE — Telephone Encounter (Signed)
[employer] --disability and FMLA-form is placed to [employer] for fill-out and  Sign.-- (urgent signature box).

## 2019-01-13 NOTE — Telephone Encounter (Signed)
Rich was this completed?

## 2019-01-13 NOTE — Telephone Encounter (Signed)
I had completed it and placed it in the fax box last week. I don't have any other [employer] paperwork in my inbox at this time  Kathrin Ruddy, NP

## 2019-01-30 NOTE — L&D Delivery Note (Signed)
Delivery Note:   G3P0020 at [redacted]w[redacted]d  Admitting diagnosis: Gestational hypertension [O13.9] Risks: preeclampsia Onset of labor: 0615 Augmentation: N/A ROM: 0615 07/10/2019  Complete dilation at 07/10/2019  1805 Onset of pushing at 1805 FHR second stage category 2 - tachy and variables  Analgesia /Anesthesia intrapartum:Epidural  Pushing in L tilt position with CNM and L&D staff support, friends x 2  present for birth and supportive.  Delivery of a Live born female  Birth Weight:  6 lb 13 oz (3090 g) APGAR: 8, 9  Newborn Delivery   Birth date/time: 07/10/2019 18:35:00 Delivery type: Vaginal, Spontaneous      in cephalic presentation, position OA to ROT.  Nuchal Cord: No  Cord double clamped after cessation of pulsation, cut by friend.  Collection of cord blood for typing completed. Cord blood donation-None  Arterial cord blood sample-yes Ph 7.158  Placenta delivered-Spontaneous  with 3 vessels . Uterotonics: Pitocin Placenta to pathology - odorous and accessory lobe noted. Uterine tone firm, bleeding minimal  None  laceration identified.  Episiotomy:None  Local analgesia: NA  Repair: NA Est. Blood Loss (mL):100.00   Complications: None   Mom to postpartum.  Baby Amir to Couplet care / Skin to Skin.  Delivery Report:   Review the Delivery Report for details.     Signed: Neta Mends, CNM, MSN 07/10/2019, 8:02 PM

## 2019-06-17 LAB — OB RESULTS CONSOLE GBS: GBS: NEGATIVE

## 2019-06-26 ENCOUNTER — Other Ambulatory Visit: Payer: Self-pay

## 2019-06-26 ENCOUNTER — Encounter (HOSPITAL_COMMUNITY): Payer: Self-pay | Admitting: *Deleted

## 2019-06-26 ENCOUNTER — Inpatient Hospital Stay (HOSPITAL_COMMUNITY)
Admission: AD | Admit: 2019-06-26 | Discharge: 2019-06-27 | Disposition: A | Discharge status: Home / Self Care | Payer: BC Managed Care – PPO | Attending: Emergency Medicine | Admitting: Emergency Medicine

## 2019-06-26 DIAGNOSIS — Y92009 Unspecified place in unspecified non-institutional (private) residence as the place of occurrence of the external cause: Secondary | ICD-10-CM

## 2019-06-26 DIAGNOSIS — Y939 Activity, unspecified: Secondary | ICD-10-CM | POA: Diagnosis not present

## 2019-06-26 DIAGNOSIS — Z79899 Other long term (current) drug therapy: Secondary | ICD-10-CM | POA: Insufficient documentation

## 2019-06-26 DIAGNOSIS — O2693 Pregnancy related conditions, unspecified, third trimester: Secondary | ICD-10-CM | POA: Insufficient documentation

## 2019-06-26 DIAGNOSIS — O26893 Other specified pregnancy related conditions, third trimester: Secondary | ICD-10-CM | POA: Diagnosis not present

## 2019-06-26 DIAGNOSIS — W1830XA Fall on same level, unspecified, initial encounter: Secondary | ICD-10-CM | POA: Diagnosis not present

## 2019-06-26 DIAGNOSIS — Z3689 Encounter for other specified antenatal screening: Secondary | ICD-10-CM

## 2019-06-26 DIAGNOSIS — Z3A37 37 weeks gestation of pregnancy: Secondary | ICD-10-CM

## 2019-06-26 DIAGNOSIS — R1033 Periumbilical pain: Secondary | ICD-10-CM | POA: Diagnosis not present

## 2019-06-26 DIAGNOSIS — Y999 Unspecified external cause status: Secondary | ICD-10-CM | POA: Insufficient documentation

## 2019-06-26 DIAGNOSIS — R102 Pelvic and perineal pain: Secondary | ICD-10-CM | POA: Diagnosis not present

## 2019-06-26 DIAGNOSIS — Y92039 Unspecified place in apartment as the place of occurrence of the external cause: Secondary | ICD-10-CM | POA: Diagnosis not present

## 2019-06-26 DIAGNOSIS — Z9181 History of falling: Secondary | ICD-10-CM

## 2019-06-26 DIAGNOSIS — W19XXXA Unspecified fall, initial encounter: Secondary | ICD-10-CM

## 2019-06-26 LAB — URINALYSIS, ROUTINE W REFLEX MICROSCOPIC
Bilirubin Urine: NEGATIVE
Glucose, UA: NEGATIVE mg/dL
Hgb urine dipstick: NEGATIVE
Ketones, ur: NEGATIVE mg/dL
Leukocytes,Ua: NEGATIVE
Nitrite: NEGATIVE
Protein, ur: NEGATIVE mg/dL
Specific Gravity, Urine: 1.016 (ref 1.005–1.030)
pH: 6 (ref 5.0–8.0)

## 2019-06-26 MED ORDER — ACETAMINOPHEN 500 MG PO TABS
1000.0000 mg | ORAL_TABLET | Freq: Once | ORAL | Status: AC
  Administered 2019-06-26: 1000 mg via ORAL
  Filled 2019-06-26: qty 2

## 2019-06-26 MED ORDER — COMFORT FIT MATERNITY SUPP MED MISC
0 refills | Status: DC
Start: 2019-06-26 — End: 2019-07-12

## 2019-06-26 NOTE — Discharge Instructions (Addendum)

## 2019-06-26 NOTE — MAU Provider Note (Signed)
History     CSN: [id number]  Arrival date and time: 06/26/19 2123   First Provider Initiated Contact with Patient 06/26/19 2255      Chief Complaint  Patient presents with  . Abdominal Pain  . Fall   Mary Bray is a 29 y.o. G3P0010 at [redacted]w[redacted]d who receives care at [facility].  She presents today for Abdominal Pain and Fall.  She endorses that she fell two days ago and did not come for evaluation because she "hates the hospital!"  She states baby has been moving okay, but she has been having pain and "I don't feel okay."  Patient states that she has not noted any blood or has had any contractions. She states after the fall she felt fine, but eventually started to feel sore.   However, patient states she is having issues with ambulation and position changes due to walking. Patient states she has taken 2 tylenol (Wednesday) and a muscle relaxant (Today from a friend) for her pain with no improvement.  Patient states she has not tried heat or cold compress to the area.  Patient endorses continued fetal movement and denies vaginal concerns including discharge or leaking.  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  Patient states she fell after she slipped on some clothes in her hallway.  Patient states she has been taken epsom salt baths with relief of symptoms.    OB History    Gravida  3   Para      Term      Preterm      AB  1   Living  0     SAB      TAB  1   Ectopic      Multiple      Live Births              Past Medical History:  Diagnosis Date  . Abnormal Pap smear   . Vaginal Pap smear, abnormal     Past Surgical History:  Procedure Laterality Date  . LEEP      History reviewed. No pertinent family history.  Social History   Tobacco Use  . Smoking status: Never Smoker  . Smokeless tobacco: Never Used  Substance Use Topics  . Alcohol use: Not Currently    Comment: occ  . Drug use: No    Allergies: No  Known Allergies  Medications Prior to Admission  Medication Sig Dispense Refill Last Dose  . Prenatal Vit-Fe Fumarate-FA (MULTIVITAMIN-PRENATAL) 27-0.8 MG TABS tablet Take 1 tablet by mouth daily at 12 noon.   06/25/2019 at Unknown time  . valACYclovir (VALTREX) 500 MG tablet Take 500 mg by mouth daily as needed (for sore outbreaks).    06/25/2019 at Unknown time  . dicyclomine (BENTYL) 20 MG tablet Take 1 tablet (20 mg total) by mouth 2 (two) times daily as needed for spasms. (Patient not taking: Reported on 11/05/2018) 20 tablet 0   . ibuprofen (ADVIL,MOTRIN) 600 MG tablet Take 1 tablet (600 mg total) by mouth every 6 (six) hours as needed. (Patient not taking: Reported on 11/05/2018) 30 tablet 0   . polyethylene glycol powder (MIRALAX) 17 GM/SCOOP powder Use 1-2 capfuls daily dissolved in 8 oz liquid as needed to encourage a bowel movement. (Patient not taking: Reported on 11/05/2018) 255 g 0     Review of Systems  Constitutional: Negative for chills and fever.  Respiratory: Negative for cough and  shortness of breath.   Gastrointestinal: Negative for abdominal pain, constipation, diarrhea, nausea and vomiting.  Genitourinary: Positive for pelvic pain. Negative for difficulty urinating, dysuria, vaginal bleeding and vaginal discharge.  Musculoskeletal: Positive for myalgias.  Neurological: Negative for dizziness, light-headedness and headaches.   Physical Exam   Blood pressure 123/86, pulse 86, temperature 98.6 F (37 C), temperature source Oral, resp. rate 16, SpO2 100 %, unknown if currently breastfeeding.  Physical Exam  Constitutional: She is oriented to person, place, and time. She appears well-developed and well-nourished.  HENT:  Head: Normocephalic and atraumatic.  Eyes: Conjunctivae are normal.  Cardiovascular: Normal rate.  Respiratory: Effort normal. No respiratory distress.  GI: Soft. There is no abdominal tenderness.  Musculoskeletal:        General: Edema: +1 pitting edema  in BLE.     Cervical back: Normal range of motion.     Right hip: No swelling, tenderness or bony tenderness.     Comments: Mild tenderness in supra pubic area.  Fetal head palpated.   Neurological: She is alert and oriented to person, place, and time.  Skin: Skin is warm and dry.  Psychiatric: She has a normal mood and affect. Her behavior is normal.    MAU Course  Procedures Results for orders placed or performed during the hospital encounter of 06/26/19 (from the past 24 hour(s))  Urinalysis, Routine w reflex microscopic     Status: Abnormal   Collection Time: 06/26/19 10:46 PM  Result Value Ref Range   Color, Urine YELLOW YELLOW   APPearance HAZY (A) CLEAR   Specific Gravity, Urine 1.016 1.005 - 1.030   pH 6.0 5.0 - 8.0   Glucose, UA NEGATIVE NEGATIVE mg/dL   Hgb urine dipstick NEGATIVE NEGATIVE   Bilirubin Urine NEGATIVE NEGATIVE   Ketones, ur NEGATIVE NEGATIVE mg/dL   Protein, ur NEGATIVE NEGATIVE mg/dL   Nitrite NEGATIVE NEGATIVE   Leukocytes,Ua NEGATIVE NEGATIVE    MDM Exam Education EFM Pain medication Assessment and Plan  29 year old, G3P0010  SIUP at 37.2weeks Cat I FT Pelvic Pain S/P Fall  -Reviewed POC with patient. -Exam performed and findings discussed.  -Informed that symptoms are consistent with pregnancy, but have been exacerated by fall. -Encouraged to not take friends medication!  -Discussed using heat and tylenol now for pain management. -Instructed to continue warm soaks ( no more than 2x/day with epsom salt), tylenol during the day with maternity belt, and flexeril at bedtime.  -Will send prescription for flexeril and will give script for maternity belt.  -Reassured that fetal tracing is reassuring.  -Informed that she will experience some pain and discomfort, but with time it should improve. -Patient without questions or concerns.  -NST reactive after position change and water given.  -Will continue to monitor and reassess.  [name] 06/26/2019, 10:55 PM   Reassessment (11:56 PM)  -Patient reports no change in pain, but is fatigued. -Reiterated plan of care to include medications and OOW until Monday. -Patient agreeable and without questions or concerns. -Strip reviewed and variable noted, but overall reactive and reassuring. -Encouraged to call or return to MAU if symptoms worsen or with the onset of new symptoms. -Discharged to home in stable condition.  [name] MSN, CNM Advanced Practice Provider, [facility]

## 2019-06-26 NOTE — ED Provider Notes (Signed)
[facility] EMERGENCY DEPARTMENT Provider Note   CSN: [id number] Arrival date & time: 06/26/19  2123     History Chief Complaint  Patient presents with  . Abdominal Pain    Mary Bray is a 29 y.o. female.  The history is provided by the patient. No language interpreter was used.  Abdominal Pain Pain location:  Suprapubic Pain quality: aching   Pain radiates to:  Does not radiate Pain severity:  Mild Onset quality:  Gradual Progression:  Worsening Chronicity:  New Relieved by:  Nothing Worsened by:  Nothing Ineffective treatments:  None tried Risk factors: pregnancy    Pt is [redacted] weeks pregnant. Pt complain of discomfort over her lower abdomen.  Pt reports she fell 2 days ago and hit on her right side.  Pt able to walk.  Pt reports area of pain is not in the area that she hit when she fell. Pt denies chest pain no back pain     Past Medical History:  Diagnosis Date  . Abnormal Pap smear   . Vaginal Pap smear, abnormal     Patient Active Problem List   Diagnosis Date Noted  . CIN II (cervical intraepithelial neoplasia II) 03/14/2011    Past Surgical History:  Procedure Laterality Date  . LEEP       OB History    Gravida  3   Para      Term      Preterm      AB  1   Living  0     SAB      TAB  1   Ectopic      Multiple      Live Births              No family history on file.  Social History   Tobacco Use  . Smoking status: Never Smoker  . Smokeless tobacco: Never Used  Substance Use Topics  . Alcohol use: Yes    Comment: occ  . Drug use: No    Home Medications Prior to Admission medications   Medication Sig Start Date End Date Taking? Authorizing Provider  dicyclomine (BENTYL) 20 MG tablet Take 1 tablet (20 mg total) by mouth 2 (two) times daily as needed for spasms. Patient not taking: Reported on 11/05/2018 09/12/18   Caccavale, Sophia, PA-C  ibuprofen (ADVIL,MOTRIN) 600 MG tablet Take 1 tablet (600 mg  total) by mouth every 6 (six) hours as needed. Patient not taking: Reported on 11/05/2018 10/27/17   Maczis, Elmer Sow, PA-C  polyethylene glycol powder (MIRALAX) 17 GM/SCOOP powder Use 1-2 capfuls daily dissolved in 8 oz liquid as needed to encourage a bowel movement. Patient not taking: Reported on 11/05/2018 09/12/18   Caccavale, Sophia, PA-C  Prenatal Vit-Fe Fumarate-FA (MULTIVITAMIN-PRENATAL) 27-0.8 MG TABS tablet Take 1 tablet by mouth daily at 12 noon.    [provider]  valACYclovir (VALTREX) 500 MG tablet Take 500 mg by mouth daily as needed (for sore outbreaks).     [provider]    Allergies    Patient has no known allergies.  Review of Systems   Review of Systems  Gastrointestinal: Positive for abdominal pain.  All other systems reviewed and are negative.   Physical Exam Updated Vital Signs BP 129/88   Pulse 92   Temp 98.5 F (36.9 C)   Resp 16   LMP  (LMP Unknown)   SpO2 100%   Physical Exam Vitals reviewed.  Constitutional:  Appearance: She is well-developed.  Cardiovascular:     Rate and Rhythm: Normal rate.  Pulmonary:     Effort: Pulmonary effort is normal.  Abdominal:     Tenderness: There is abdominal tenderness in the suprapubic area.  Skin:    General: Skin is warm.  Neurological:     General: No focal deficit present.     Mental Status: She is alert.  Psychiatric:        Mood and Affect: Mood normal.     ED Results / Procedures / Treatments   Labs (all labs ordered are listed, but only abnormal results are displayed) Labs Reviewed - No data to display  EKG None  Radiology No results found.  Procedures Procedures (including critical care time)  Medications Ordered in ED Medications - No data to display  ED Course  I have reviewed the triage vital signs and the nursing notes.  Pertinent labs & imaging results that were available during my care of the patient were reviewed by me and considered in my medical  decision making (see chart for details).    MDM Rules/Calculators/A&P                      MDM: [redacted] weeks pregnant pt followed by [place]. I doubt pain is related to fall. Pt has no bruising. No bony pain.  I spoke to provider at MAU who will see.  Final Clinical Impression(s) / ED Diagnoses Final diagnoses:  [redacted] weeks gestation of pregnancy    Rx / DC Orders ED Discharge Orders    None       Sidney Ace 06/26/19 2156    Maudie Flakes, MD 07/02/19 [phone]

## 2019-06-26 NOTE — ED Triage Notes (Signed)
Pt is [redacted] weeks pregnant, fell on Wednesday. Reports abdominal pain since fall, denies vaginal bleeding or cramping. Baby has had increased movements. Also c/o headache, took tylenol eariler for pain. Clydie Braun PA to triage for eval

## 2019-06-26 NOTE — MAU Note (Signed)
Fell on Wednesday afternoon, fell over a pile of clothes, on right side.  Baby has been moving well.  No bleeding. No leaking. Immediately, started having pelvic pain and Tylenol is not helping. Hurts to walk, get in bed, turn in bed.

## 2019-06-27 DIAGNOSIS — O2693 Pregnancy related conditions, unspecified, third trimester: Secondary | ICD-10-CM | POA: Diagnosis not present

## 2019-06-27 MED ORDER — CYCLOBENZAPRINE HCL 10 MG PO TABS
10.0000 mg | ORAL_TABLET | Freq: Two times a day (BID) | ORAL | 0 refills | Status: DC | PRN
Start: 2019-06-27 — End: 2019-07-10

## 2019-07-06 ENCOUNTER — Other Ambulatory Visit: Payer: Self-pay

## 2019-07-06 ENCOUNTER — Inpatient Hospital Stay (HOSPITAL_BASED_OUTPATIENT_CLINIC_OR_DEPARTMENT_OTHER): Payer: BC Managed Care – PPO

## 2019-07-06 ENCOUNTER — Encounter (HOSPITAL_COMMUNITY): Payer: Self-pay | Admitting: Obstetrics & Gynecology

## 2019-07-06 ENCOUNTER — Inpatient Hospital Stay (HOSPITAL_COMMUNITY)
Admission: AD | Admit: 2019-07-06 | Discharge: 2019-07-06 | Disposition: A | Discharge status: Home / Self Care | Payer: BC Managed Care – PPO | Source: Ambulatory Visit | Attending: Obstetrics & Gynecology | Admitting: Obstetrics & Gynecology

## 2019-07-06 DIAGNOSIS — O368390 Maternal care for abnormalities of the fetal heart rate or rhythm, unspecified trimester, not applicable or unspecified: Secondary | ICD-10-CM

## 2019-07-06 DIAGNOSIS — O289 Unspecified abnormal findings on antenatal screening of mother: Secondary | ICD-10-CM

## 2019-07-06 DIAGNOSIS — Z3A38 38 weeks gestation of pregnancy: Secondary | ICD-10-CM

## 2019-07-06 DIAGNOSIS — R109 Unspecified abdominal pain: Secondary | ICD-10-CM | POA: Diagnosis present

## 2019-07-06 DIAGNOSIS — O1213 Gestational proteinuria, third trimester: Secondary | ICD-10-CM

## 2019-07-06 LAB — COMPREHENSIVE METABOLIC PANEL
ALT: 16 U/L (ref 0–44)
AST: 26 U/L (ref 15–41)
Albumin: 2.5 g/dL — ABNORMAL LOW (ref 3.5–5.0)
Alkaline Phosphatase: 354 U/L — ABNORMAL HIGH (ref 38–126)
Anion gap: 8 (ref 5–15)
BUN: 7 mg/dL (ref 6–20)
CO2: 25 mmol/L (ref 22–32)
Calcium: 8.7 mg/dL — ABNORMAL LOW (ref 8.9–10.3)
Chloride: 106 mmol/L (ref 98–111)
Creatinine, Ser: 0.57 mg/dL (ref 0.44–1.00)
GFR calc Af Amer: >60 mL/min (ref >60)
GFR calc non Af Amer: >60 mL/min (ref >60)
Glucose, Bld: 87 mg/dL (ref 70–99)
Potassium: 3.6 mmol/L (ref 3.5–5.1)
Sodium: 139 mmol/L (ref 135–145)
Total Bilirubin: 0.3 mg/dL (ref 0.3–1.2)
Total Protein: 6.1 g/dL — ABNORMAL LOW (ref 6.5–8.1)

## 2019-07-06 LAB — PROTEIN / CREATININE RATIO, URINE
Creatinine, Urine: 120.9 mg/dL
Protein Creatinine Ratio: 0.55 mg/mg{Cre} — ABNORMAL HIGH (ref 0.00–0.15)
Total Protein, Urine: 67 mg/dL

## 2019-07-06 LAB — CBC
HCT: 34.5 % — ABNORMAL LOW (ref 36.0–46.0)
Hemoglobin: 10.7 g/dL — ABNORMAL LOW (ref 12.0–15.0)
MCH: 26.4 pg (ref 26.0–34.0)
MCHC: 31 g/dL (ref 30.0–36.0)
MCV: 85.2 fL (ref 80.0–100.0)
Platelets: 278 10*3/uL (ref 150–400)
RBC: 4.05 MIL/uL (ref 3.87–5.11)
RDW: 15.9 % — ABNORMAL HIGH (ref 11.5–15.5)
WBC: 7.9 10*3/uL (ref 4.0–10.5)
nRBC: 0 % (ref 0.0–0.2)

## 2019-07-06 LAB — URIC ACID: Uric Acid, Serum: 4.3 mg/dL (ref 2.5–7.1)

## 2019-07-06 LAB — LACTATE DEHYDROGENASE: LDH: 182 U/L (ref 98–192)

## 2019-07-06 NOTE — MAU Note (Signed)
. °  Mary Bray is a 29 y.o. at [redacted]w[redacted]d here in MAU reporting: she was sent from office for contractions and increase in blood pressure.   Onset of complaint: today in office Pain score: 0 Vitals:   07/06/19 1706  BP: 129/86  Pulse: 92  Resp: 16  Temp: 98.4 F (36.9 C)  SpO2: 100%     FHT:138 Lab orders aced from triage: UA

## 2019-07-06 NOTE — MAU Provider Note (Signed)
Chief Complaint:  Contractions and Hypertension   None    HPI: Mary Bray is a 29 y.o. G3P0010 at [redacted]w[redacted]d who presents to maternity admissions reporting she was sent by [facility] for NST, BPP and labs, spoke with Kizzie Furnish, pt was dx 1 week ago with proteinuria, seen and cleared by MFM, Her PCR was 0.567, she denies nuero s/sx, denies cp, sob, HA, RUQ pain or swelling. She endorses having increased swelling in her lower extremities but appears better to her now then yesterday. She admits to eating fast food content. Vivki consulted with Dr Mancel Bale and desires NST, BPP, and repeat PreE labs. Pt next ROB visit is this coming Wednesday with [facility].  Denies contractions, leakage of fluid or vaginal bleeding. Good fetal movement.   Pregnancy Course:   Past Medical History:  Diagnosis Date   Abnormal Pap smear    Vaginal Pap smear, abnormal    OB History  Gravida Para Term Preterm AB Living  3       2 0  SAB TAB Ectopic Multiple Live Births  1 1          # Outcome Date GA Lbr Len/2nd Weight Sex Delivery Anes PTL Lv  3 Current           2 SAB           1 TAB            Past Surgical History:  Procedure Laterality Date   LEEP     History reviewed. No pertinent family history. Social History   Tobacco Use   Smoking status: Never Smoker   Smokeless tobacco: Never Used  Substance Use Topics   Alcohol use: Not Currently    Comment: occ   Drug use: No   No Known Allergies Medications Prior to Admission  Medication Sig Dispense Refill Last Dose   cyclobenzaprine (FLEXERIL) 10 MG tablet Take 1 tablet (10 mg total) by mouth 2 (two) times daily as needed for muscle spasms. 20 tablet 0 Past Week at Unknown time   Elastic Bandages & Supports (COMFORT FIT MATERNITY SUPP MED) MISC Wear belt during the day, as needed, and remove at night prior to bedtime. 1 each 0 07/06/2019 at Unknown time   Prenatal Vit-Fe Fumarate-FA (MULTIVITAMIN-PRENATAL) 27-0.8 MG TABS tablet Take 1 tablet by mouth  daily at 12 noon.   07/06/2019 at Unknown time   valACYclovir (VALTREX) 500 MG tablet Take 500 mg by mouth daily as needed (for sore outbreaks).    More than a month at Unknown time    I have reviewed patient's Past Medical Hx, Surgical Hx, Family Hx, Social Hx, medications and allergies.   ROS:  Review of Systems  Constitutional: Negative.   HENT: Negative.   Eyes: Negative.   Respiratory: Negative.   Cardiovascular: Negative.   Gastrointestinal: Negative.   Endocrine: Negative.   Genitourinary: Negative.   Musculoskeletal: Negative.   Skin: Negative.   Allergic/Immunologic: Negative.   Neurological: Negative.   Hematological: Negative.     Physical Exam   Patient Vitals for the past 24 hrs:  BP Temp Pulse Resp SpO2 Height Weight  07/06/19 1912 133/80 -- 84 15 99 % -- --  07/06/19 1745 122/86 -- -- -- -- -- --  07/06/19 1732 104/89 -- 92 -- -- -- --  07/06/19 1715 132/83 -- 89 -- -- -- --  07/06/19 1706 129/86 98.4 F (36.9 C) 92 16 100 % 5' (1.524 m) 58.5 kg  07/06/19 1705 129/86 --  88 -- -- -- --   Constitutional: Well-developed, well-nourished female in no acute distress.  Cardiovascular: normal rate Respiratory: normal effort GI: Abd soft, non-tender, gravid appropriate for gestational age. Pos BS x 4 MS: Extremities nontender, 1+ pitting edema to bilaterally lower extremities, edema, normal ROM Neurologic: Alert and oriented x 4.  GU: Neg CVAT.  Pelvic: Deferred  NST: FHR baseline 130 bpm, Variability: moderate, Accelerations:present, Decelerations:  Absent= Cat 1/Reactive UC:   Occ SVE: Deferred.   Labs: Results for orders placed or performed during the hospital encounter of 07/06/19 (from the past 24 hour(s))  Protein / creatinine ratio, urine     Status: Abnormal   Collection Time: 07/06/19  6:00 PM  Result Value Ref Range   Creatinine, Urine 120.90 mg/dL   Total Protein, Urine 67 mg/dL   Protein Creatinine Ratio 0.55 (H) 0.00 - 0.15 mg/mg[Cre]   Comprehensive metabolic panel     Status: Abnormal   Collection Time: 07/06/19  6:30 PM  Result Value Ref Range   Sodium 139 135 - 145 mmol/L   Potassium 3.6 3.5 - 5.1 mmol/L   Chloride 106 98 - 111 mmol/L   CO2 25 22 - 32 mmol/L   Glucose, Bld 87 70 - 99 mg/dL   BUN 7 6 - 20 mg/dL   Creatinine, Ser 8.58 0.44 - 1.00 mg/dL   Calcium 8.7 (L) 8.9 - 10.3 mg/dL   Total Protein 6.1 (L) 6.5 - 8.1 g/dL   Albumin 2.5 (L) 3.5 - 5.0 g/dL   AST 26 15 - 41 U/L   ALT 16 0 - 44 U/L   Alkaline Phosphatase 354 (H) 38 - 126 U/L   Total Bilirubin 0.3 0.3 - 1.2 mg/dL   GFR calc non Af Amer >60 >60 mL/min   GFR calc Af Amer >60 >60 mL/min   Anion gap 8 5 - 15  CBC     Status: Abnormal   Collection Time: 07/06/19  6:30 PM  Result Value Ref Range   WBC 7.9 4.0 - 10.5 K/uL   RBC 4.05 3.87 - 5.11 MIL/uL   Hemoglobin 10.7 (L) 12.0 - 15.0 g/dL   HCT 85.0 (L) 27.7 - 41.2 %   MCV 85.2 80.0 - 100.0 fL   MCH 26.4 26.0 - 34.0 pg   MCHC 31.0 30.0 - 36.0 g/dL   RDW 87.8 (H) 67.6 - 72.0 %   Platelets 278 150 - 400 K/uL   nRBC 0.0 0.0 - 0.2 %  Uric acid     Status: None   Collection Time: 07/06/19  6:30 PM  Result Value Ref Range   Uric Acid, Serum 4.3 2.5 - 7.1 mg/dL  Lactate dehydrogenase     Status: None   Collection Time: 07/06/19  6:30 PM  Result Value Ref Range   LDH 182 98 - 192 U/L    Imaging:  No results found.  MAU Course: Orders Placed This Encounter  Procedures   [place] MFM FETAL BPP WO NON STRESS   Comprehensive metabolic panel   CBC   Uric acid   Protein / creatinine ratio, urine   Lactate dehydrogenase   Fetal non-stress test   Fetal Kick Count:  Lie on our left side for one hour after a meal, and count the number of times your baby kicks.  If it is less than 5 times, get up, move around and drink some juice.  Repeat the test 30 minutes later.  If it is still less than 5 kicks  in an hour, notify your doctor.   LABOR:  When conractions begin, you should start to time them  from the beginning of one contraction to the beginning  of the next.  When contractions are 5 - 10 minutes apart or less and have been regular for at least an hour, you should call your health care provider.   Notify physician for bleeding from the vagina   Notify physician for pain or burning when urinating   Notify physician for chills or fever   Notify physician for increase in vaginal discharge   Notify physician for pelvic pressure (sudden increase)   Notify physician if baby moving less than usual   Notify physician for sudden, constant, or occasional abdominal pain   Notify physician for sudden gushing of fluid from the vagina (with or without continued leaking)   Notify physician for leaking of fluid   Notify physician for fainting spells, "black outs" or loss of consciousness   Notify physician for severe or continued nausea or vomiting   Notify physician for blurring of vision or spots before the eyes   Discharge instructions   Discharge patient Discharge disposition: 01-Home or Self Care; Discharge patient date: 07/06/2019   No orders of the defined types were placed in this encounter.   MDM: Consulted with Dr Mora Appl.   BPP: 8/8 NST: Reactive VSS ; BP 122/86, stable, asymptomatic.  CBC, CMP, Uric acid, and LDH all unremarkable    Assessment: 1. Gestational proteinuria in third trimester   2. Minimal variability in fetal heart rate   Pt stable, BP 129/86, asymptomatic, no neuro s/sx. Labs unremarkable, continued gestational proteinuria, planned for induction at 40 weeks per MFM. NST reactive and BPP 8/8. PCR uncharged at 0.5, labs unremarkable.   Plan: Discharge home in stable condition.  Labor precautions and fetal kick counts Preeclampsia precautions. Report HA, RUQ pain or vision changes.  Follow-up Information    [facility] Follow up.   Specialty: Obstetrics and Gynecology Why: 07/08/2019 ROB Contact information: [address] [place] [address] [phone]          Allergies as of 07/06/2019   No Known Allergies     Medication List    TAKE these medications   Comfort Fit Maternity Supp Med Misc Wear belt during the day, as needed, and remove at night prior to bedtime.   cyclobenzaprine 10 MG tablet Commonly known as: FLEXERIL Take 1 tablet (10 mg total) by mouth 2 (two) times daily as needed for muscle spasms.   multivitamin-prenatal 27-0.8 MG Tabs tablet Take 1 tablet by mouth daily at 12 noon.   valACYclovir 500 MG tablet Commonly known as: VALTREX Take 500 mg by mouth daily as needed (for sore outbreaks).       [facility] NP-C, CNM [place], [place], [place] 07/06/2019 7:20 PM

## 2019-07-09 ENCOUNTER — Encounter (HOSPITAL_COMMUNITY): Payer: Self-pay | Admitting: *Deleted

## 2019-07-09 ENCOUNTER — Telehealth (HOSPITAL_COMMUNITY): Payer: Self-pay | Admitting: *Deleted

## 2019-07-09 NOTE — Telephone Encounter (Signed)
Preadmission screen  

## 2019-07-10 ENCOUNTER — Inpatient Hospital Stay (HOSPITAL_COMMUNITY): Payer: BC Managed Care – PPO | Admitting: Anesthesiology

## 2019-07-10 ENCOUNTER — Inpatient Hospital Stay (HOSPITAL_COMMUNITY)
Admit: 2019-07-10 | Discharge: 2019-07-12 | DRG: 806 | Disposition: A | Discharge status: Home / Self Care | Payer: BC Managed Care – PPO | Attending: Obstetrics and Gynecology | Admitting: Obstetrics and Gynecology

## 2019-07-10 ENCOUNTER — Encounter (HOSPITAL_COMMUNITY): Payer: Self-pay | Admitting: Obstetrics and Gynecology

## 2019-07-10 ENCOUNTER — Other Ambulatory Visit: Payer: Self-pay

## 2019-07-10 DIAGNOSIS — Z20822 Contact with and (suspected) exposure to covid-19: Secondary | ICD-10-CM | POA: Diagnosis present

## 2019-07-10 DIAGNOSIS — O9902 Anemia complicating childbirth: Secondary | ICD-10-CM | POA: Diagnosis present

## 2019-07-10 DIAGNOSIS — A6000 Herpesviral infection of urogenital system, unspecified: Secondary | ICD-10-CM | POA: Diagnosis present

## 2019-07-10 DIAGNOSIS — O1493 Unspecified pre-eclampsia, third trimester: Secondary | ICD-10-CM | POA: Diagnosis not present

## 2019-07-10 DIAGNOSIS — O26899 Other specified pregnancy related conditions, unspecified trimester: Secondary | ICD-10-CM

## 2019-07-10 DIAGNOSIS — Z3A39 39 weeks gestation of pregnancy: Secondary | ICD-10-CM | POA: Diagnosis not present

## 2019-07-10 DIAGNOSIS — O1490 Unspecified pre-eclampsia, unspecified trimester: Secondary | ICD-10-CM | POA: Diagnosis present

## 2019-07-10 DIAGNOSIS — O9832 Other infections with a predominantly sexual mode of transmission complicating childbirth: Secondary | ICD-10-CM | POA: Diagnosis present

## 2019-07-10 DIAGNOSIS — Z6791 Unspecified blood type, Rh negative: Secondary | ICD-10-CM | POA: Diagnosis not present

## 2019-07-10 DIAGNOSIS — O1404 Mild to moderate pre-eclampsia, complicating childbirth: Secondary | ICD-10-CM | POA: Diagnosis present

## 2019-07-10 DIAGNOSIS — O26893 Other specified pregnancy related conditions, third trimester: Secondary | ICD-10-CM | POA: Diagnosis present

## 2019-07-10 DIAGNOSIS — O1214 Gestational proteinuria, complicating childbirth: Secondary | ICD-10-CM | POA: Diagnosis present

## 2019-07-10 LAB — RPR: RPR Ser Ql: NONREACTIVE

## 2019-07-10 LAB — CBC
HCT: 35.1 % — ABNORMAL LOW (ref 36.0–46.0)
Hemoglobin: 10.9 g/dL — ABNORMAL LOW (ref 12.0–15.0)
MCH: 26.3 pg (ref 26.0–34.0)
MCHC: 31.1 g/dL (ref 30.0–36.0)
MCV: 84.8 fL (ref 80.0–100.0)
Platelets: 280 10*3/uL (ref 150–400)
RBC: 4.14 MIL/uL (ref 3.87–5.11)
RDW: 15.9 % — ABNORMAL HIGH (ref 11.5–15.5)
WBC: 7.3 10*3/uL (ref 4.0–10.5)
nRBC: 0 % (ref 0.0–0.2)

## 2019-07-10 LAB — COMPREHENSIVE METABOLIC PANEL
ALT: 15 U/L (ref 0–44)
AST: 24 U/L (ref 15–41)
Albumin: 2.6 g/dL — ABNORMAL LOW (ref 3.5–5.0)
Alkaline Phosphatase: 368 U/L — ABNORMAL HIGH (ref 38–126)
Anion gap: 11 (ref 5–15)
BUN: 7 mg/dL (ref 6–20)
CO2: 22 mmol/L (ref 22–32)
Calcium: 9.3 mg/dL (ref 8.9–10.3)
Chloride: 105 mmol/L (ref 98–111)
Creatinine, Ser: 0.6 mg/dL (ref 0.44–1.00)
GFR calc Af Amer: >60 mL/min (ref >60)
GFR calc non Af Amer: >60 mL/min (ref >60)
Glucose, Bld: 100 mg/dL — ABNORMAL HIGH (ref 70–99)
Potassium: 3.4 mmol/L — ABNORMAL LOW (ref 3.5–5.1)
Sodium: 138 mmol/L (ref 135–145)
Total Bilirubin: 0.4 mg/dL (ref 0.3–1.2)
Total Protein: 6.3 g/dL — ABNORMAL LOW (ref 6.5–8.1)

## 2019-07-10 LAB — ABO/RH: ABO/RH(D): O NEG

## 2019-07-10 LAB — TYPE AND SCREEN
ABO/RH(D): O NEG
Antibody Screen: NEGATIVE

## 2019-07-10 LAB — PROTEIN / CREATININE RATIO, URINE
Creatinine, Urine: 151.73 mg/dL
Protein Creatinine Ratio: 0.43 mg/mg{Cre} — ABNORMAL HIGH (ref 0.00–0.15)
Total Protein, Urine: 65 mg/dL

## 2019-07-10 LAB — URINALYSIS, ROUTINE W REFLEX MICROSCOPIC
Bilirubin Urine: NEGATIVE
Glucose, UA: NEGATIVE mg/dL
Hgb urine dipstick: NEGATIVE
Ketones, ur: NEGATIVE mg/dL
Leukocytes,Ua: NEGATIVE
Nitrite: NEGATIVE
Protein, ur: 100 mg/dL — AB
Specific Gravity, Urine: 1.02 (ref 1.005–1.030)
pH: 6 (ref 5.0–8.0)

## 2019-07-10 LAB — SARS CORONAVIRUS 2 BY RT PCR (HOSPITAL ORDER, PERFORMED IN ~~LOC~~ HOSPITAL LAB): SARS Coronavirus 2: NEGATIVE

## 2019-07-10 MED ORDER — SODIUM CHLORIDE (PF) 0.9 % IJ SOLN
INTRAMUSCULAR | Status: DC | PRN
  Administered 2019-07-10: 12 mL/h via EPIDURAL

## 2019-07-10 MED ORDER — METHYLERGONOVINE MALEATE 0.2 MG PO TABS: 0.2000 mg | ORAL_TABLET | ORAL | Status: DC | PRN

## 2019-07-10 MED ORDER — OXYTOCIN BOLUS FROM INFUSION
500.0000 mL | Freq: Once | INTRAVENOUS | Status: AC
  Administered 2019-07-10: 500 mL via INTRAVENOUS

## 2019-07-10 MED ORDER — FENTANYL CITRATE (PF) 100 MCG/2ML IJ SOLN: 50.0000 ug | INTRAMUSCULAR | Status: DC | PRN

## 2019-07-10 MED ORDER — BISACODYL 10 MG RE SUPP: 10.0000 mg | Freq: Every day | RECTAL | Status: DC | PRN

## 2019-07-10 MED ORDER — ONDANSETRON HCL 4 MG/2ML IJ SOLN: 4.0000 mg | Freq: Four times a day (QID) | INTRAMUSCULAR | Status: DC | PRN

## 2019-07-10 MED ORDER — ONDANSETRON HCL 4 MG/2ML IJ SOLN: 4.0000 mg | INTRAMUSCULAR | Status: DC | PRN

## 2019-07-10 MED ORDER — LIDOCAINE HCL (PF) 1 % IJ SOLN: 30.0000 mL | INTRAMUSCULAR | Status: DC | PRN

## 2019-07-10 MED ORDER — ACETAMINOPHEN 500 MG PO TABS
1000.0000 mg | ORAL_TABLET | Freq: Four times a day (QID) | ORAL | Status: DC | PRN
  Administered 2019-07-10 (×2): 1000 mg via ORAL
  Filled 2019-07-10 (×2): qty 2

## 2019-07-10 MED ORDER — ZOLPIDEM TARTRATE 5 MG PO TABS: 5.0000 mg | ORAL_TABLET | Freq: Every evening | ORAL | Status: DC | PRN

## 2019-07-10 MED ORDER — OXYTOCIN 10 UNIT/ML IJ SOLN: 10.0000 [IU] | Freq: Once | INTRAMUSCULAR | Status: DC

## 2019-07-10 MED ORDER — SIMETHICONE 80 MG PO CHEW: 80.0000 mg | CHEWABLE_TABLET | ORAL | Status: DC | PRN

## 2019-07-10 MED ORDER — WITCH HAZEL-GLYCERIN EX PADS: 1.0000 "application " | MEDICATED_PAD | CUTANEOUS | Status: DC | PRN

## 2019-07-10 MED ORDER — LACTATED RINGERS IV SOLN
500.0000 mL | INTRAVENOUS | Status: DC | PRN
  Administered 2019-07-10: 1000 mL via INTRAVENOUS

## 2019-07-10 MED ORDER — SOD CITRATE-CITRIC ACID 500-334 MG/5ML PO SOLN: 30.0000 mL | ORAL | Status: DC | PRN

## 2019-07-10 MED ORDER — TETANUS-DIPHTH-ACELL PERTUSSIS 5-2.5-18.5 LF-MCG/0.5 IM SUSP: 0.5000 mL | Freq: Once | INTRAMUSCULAR | Status: DC

## 2019-07-10 MED ORDER — SENNOSIDES-DOCUSATE SODIUM 8.6-50 MG PO TABS
2.0000 | ORAL_TABLET | ORAL | Status: DC
  Administered 2019-07-10 – 2019-07-11 (×2): 2 via ORAL
  Filled 2019-07-10 (×2): qty 2

## 2019-07-10 MED ORDER — OXYTOCIN-SODIUM CHLORIDE 30-0.9 UT/500ML-% IV SOLN
2.5000 [IU]/h | INTRAVENOUS | Status: DC
  Filled 2019-07-10: qty 1000

## 2019-07-10 MED ORDER — LACTATED RINGERS IV SOLN: INTRAVENOUS | Status: DC

## 2019-07-10 MED ORDER — ONDANSETRON HCL 4 MG PO TABS: 4.0000 mg | ORAL_TABLET | ORAL | Status: DC | PRN

## 2019-07-10 MED ORDER — LIDOCAINE HCL (PF) 1 % IJ SOLN
INTRAMUSCULAR | Status: DC | PRN
  Administered 2019-07-10: 10 mL via EPIDURAL

## 2019-07-10 MED ORDER — DIPHENHYDRAMINE HCL 25 MG PO CAPS: 25.0000 mg | ORAL_CAPSULE | Freq: Four times a day (QID) | ORAL | Status: DC | PRN

## 2019-07-10 MED ORDER — IBUPROFEN 600 MG PO TABS
600.0000 mg | ORAL_TABLET | Freq: Four times a day (QID) | ORAL | Status: DC
  Administered 2019-07-10 – 2019-07-12 (×7): 600 mg via ORAL
  Filled 2019-07-10 (×7): qty 1

## 2019-07-10 MED ORDER — COCONUT OIL OIL: 1.0000 "application " | TOPICAL_OIL | Status: DC | PRN

## 2019-07-10 MED ORDER — IBUPROFEN 800 MG PO TABS
800.0000 mg | ORAL_TABLET | Freq: Once | ORAL | Status: AC
  Administered 2019-07-10: 800 mg via ORAL
  Filled 2019-07-10: qty 1

## 2019-07-10 MED ORDER — ACETAMINOPHEN 500 MG PO TABS
1000.0000 mg | ORAL_TABLET | Freq: Four times a day (QID) | ORAL | Status: DC
  Administered 2019-07-10 – 2019-07-12 (×7): 1000 mg via ORAL
  Filled 2019-07-10 (×7): qty 2

## 2019-07-10 MED ORDER — BENZOCAINE-MENTHOL 20-0.5 % EX AERO: 1.0000 "application " | INHALATION_SPRAY | CUTANEOUS | Status: DC | PRN

## 2019-07-10 MED ORDER — PRENATAL MULTIVITAMIN CH
1.0000 | ORAL_TABLET | Freq: Every day | ORAL | Status: DC
  Administered 2019-07-11 – 2019-07-12 (×2): 1 via ORAL
  Filled 2019-07-10 (×2): qty 1

## 2019-07-10 MED ORDER — FLEET ENEMA 7-19 GM/118ML RE ENEM: 1.0000 | ENEMA | Freq: Every day | RECTAL | Status: DC | PRN

## 2019-07-10 MED ORDER — METHYLERGONOVINE MALEATE 0.2 MG/ML IJ SOLN: 0.2000 mg | INTRAMUSCULAR | Status: DC | PRN

## 2019-07-10 MED ORDER — FENTANYL-BUPIVACAINE-NACL 0.5-0.125-0.9 MG/250ML-% EP SOLN
EPIDURAL | Status: AC
  Filled 2019-07-10: qty 250

## 2019-07-10 MED ORDER — DIBUCAINE (PERIANAL) 1 % EX OINT: 1.0000 "application " | TOPICAL_OINTMENT | CUTANEOUS | Status: DC | PRN

## 2019-07-10 NOTE — Progress Notes (Signed)
Wynelle Bourgeois CNM aware of pt's admission and status. Aware of elevated b/ps and sve. Will see pt

## 2019-07-10 NOTE — Progress Notes (Signed)
Patient ID: Mary Bray, female   DOB: 02/28/1990, 29 y.o.   MRN: [id number] S: Doing well, pain well controlled with  Epidural, no pelvic pressure. SIL at [facility] and supportive.  Denies HA/NV/RUQ pain/visual changes.     O: Vitals:   07/10/19 0831 07/10/19 0901 07/10/19 0930 07/10/19 1001  BP: 112/85 118/73 128/81 (!) 129/106  Pulse: 91 61 67 73  Resp:      Temp:      TempSrc:      Weight:      Height:         FHT:  FHR: 120 bpm, variability: minimal ,  accelerations:  Present, rare and small,  decelerations:  Absent UC:   regular, every 2-3 minutes SVE:   Dilation: 5 Effacement (%): 90 Station: -1 Exam by:: Roel Douthat,cnm Cervical stenosis digitally released from 1 cm to 5 cm, vertex well applied, ROT No fluid noted w/ exam, no membranes felt  A / P: Spontaneous labor, progressing normally Preeclampsia w/o SF BP normotensive, no neural sx. Continue monitoring closely. Fetal Wellbeing:  Category I Pain Control:  Epidural  Anticipated MOD:  NSVD  Continue with position changes, peanut ball use.  Neta Mends, CNM, MSN 07/10/2019, 10:13 AM

## 2019-07-10 NOTE — MAU Note (Signed)
covid swab obtained without difficulty and pt tol well. No symptoms °

## 2019-07-10 NOTE — Anesthesia Preprocedure Evaluation (Signed)
Anesthesia Evaluation  Patient identified by MRN, date of birth, ID band Patient awake    Reviewed: Allergy & Precautions, Patient's Chart, lab work & pertinent test results  Airway Mallampati: II  TM Distance: >3 FB Neck ROM: Full    Dental no notable dental hx.    Pulmonary neg pulmonary ROS,    Pulmonary exam normal breath sounds clear to auscultation       Cardiovascular negative cardio ROS Normal cardiovascular exam Rhythm:Regular Rate:Normal     Neuro/Psych negative neurological ROS  negative psych ROS   GI/Hepatic negative GI ROS, Neg liver ROS,   Endo/Other  negative endocrine ROS  Renal/GU negative Renal ROS  negative genitourinary   Musculoskeletal negative musculoskeletal ROS (+)   Abdominal   Peds negative pediatric ROS (+)  Hematology  (+) Blood dyscrasia, anemia , hct 35.1, plt 280   Anesthesia Other Findings   Reproductive/Obstetrics (+) Pregnancy                             Anesthesia Physical Anesthesia Plan  ASA: II and emergent  Anesthesia Plan: Epidural   Post-op Pain Management:    Induction:   PONV Risk Score and Plan: 2  Airway Management Planned: Natural Airway  Additional Equipment: None  Intra-op Plan:   Post-operative Plan:   Informed Consent: I have reviewed the patients History and Physical, chart, labs and discussed the procedure including the risks, benefits and alternatives for the proposed anesthesia with the patient or authorized representative who has indicated his/her understanding and acceptance.       Plan Discussed with:   Anesthesia Plan Comments:         Anesthesia Quick Evaluation

## 2019-07-10 NOTE — MAU Provider Note (Signed)
Chief Complaint:  Contractions   First Provider Initiated Contact with Patient 07/10/19 0331     HPI  HPI: Mary Bray is a 29 y.o. G3P0020 at 58w2dwho presents to maternity admissions reporting painful contractions.  Was scheduled for IOL next week for borderline GHTN and proteinuria.  Tonight has elevated BPs so I was asked to see her. . She reports good fetal movement, denies LOF, vaginal bleeding, vaginal itching/burning, urinary symptoms, h/a, dizziness, n/v, diarrhea, constipation or fever/chills.  She denies headache, visual changes or RUQ abdominal pain.   Past Medical History: Past Medical History:  Diagnosis Date  . Abnormal Pap smear   . Anemia   . Herpes simplex virus type 1 (HSV-1) dermatitis   . High risk HPV infection   . HSV-2 (herpes simplex virus 2) infection   . Vaginal Pap smear, abnormal     Past obstetric history: OB History  Gravida Para Term Preterm AB Living  3       2 0  SAB TAB Ectopic Multiple Live Births  1 1          # Outcome Date GA Lbr Len/2nd Weight Sex Delivery Anes PTL Lv  3 Current           2 SAB           1 TAB             Past Surgical History: Past Surgical History:  Procedure Laterality Date  . LEEP      Family History: History reviewed. No pertinent family history.  Social History: Social History   Tobacco Use  . Smoking status: Never Smoker  . Smokeless tobacco: Never Used  Vaping Use  . Vaping Use: Never used  Substance Use Topics  . Alcohol use: Not Currently    Comment: occ  . Drug use: No    Allergies: No Known Allergies  Meds:  Medications Prior to Admission  Medication Sig Dispense Refill Last Dose  . acetaminophen (TYLENOL) 500 MG tablet Take 500 mg by mouth every 6 (six) hours as needed.     . Prenatal Vit-Fe Fumarate-FA (MULTIVITAMIN-PRENATAL) 27-0.8 MG TABS tablet Take 1 tablet by mouth daily at 12 noon.   07/09/2019 at Unknown time  . valACYclovir (VALTREX) 500 MG tablet Take 500 mg by mouth  daily as needed (for sore outbreaks).    07/09/2019 at Unknown time  . cyclobenzaprine (FLEXERIL) 10 MG tablet Take 1 tablet (10 mg total) by mouth 2 (two) times daily as needed for muscle spasms. 20 tablet 0   . Elastic Bandages & Supports (COMFORT FIT MATERNITY SUPP MED) MISC Wear belt during the day, as needed, and remove at night prior to bedtime. 1 each 0     I have reviewed patient's Past Medical Hx, Surgical Hx, Family Hx, Social Hx, medications and allergies.   ROS:  Review of Systems  Constitutional: Negative for chills, fatigue and fever.  Eyes: Negative for visual disturbance.  Respiratory: Negative for shortness of breath.   Gastrointestinal: Positive for abdominal pain. Negative for constipation, diarrhea and nausea.  Genitourinary: Positive for pelvic pain. Negative for vaginal bleeding.  Neurological: Negative for weakness and headaches.   Other systems negative  Physical Exam   Patient Vitals for the past 24 hrs:  BP Temp Pulse Resp Height Weight  07/10/19 0315 (!) 132/91 -- 93 -- -- --  07/10/19 0314 135/89 -- 89 -- -- --  07/10/19 0307 135/88 -- 95 -- -- --  07/10/19 1610 Marland Kitchen)  130/91 -- 94 -- -- --  07/10/19 0242 -- 98.3 F (36.8 C) -- 16 5' (1.524 m) 58.1 kg   Constitutional: Well-developed, well-nourished female in no acute distress.  Cardiovascular: normal rate and rhythm Respiratory: normal effort, clear to auscultation bilaterally GI: Abd soft, non-tender, gravid appropriate for gestational age.   No rebound or guarding. MS: Extremities nontender, no edema, normal ROM Neurologic: Alert and oriented x 4.  GU: Neg CVAT.  PELVIC EXAM:  Dilation: Closed Effacement (%): 80 Cervical Position: Anterior Station: -2 Exam by:: Quintella Baton RNC  FHT:  Baseline 125 , moderate variability, accelerations present, no decelerations Contractions: q 2 mins Irregular    Labs: Pending but Protein/Creat Ratio earlier this week was 0.5  O/Positive/-- (11/20  0000)  Imaging:    MAU Course/MDM: I have ordered labs and reviewed results from last visit NST reviewed, reactive, category I Consult Dr Charlotta Newton with presentation, exam findings and test results. She recommends induction of labor for Preeclampsia Treatments in MAU included EFM.    Assessment: Single IUP at [redacted]w[redacted]d Elevated BP and proteinuria Edema Preeclampsia without severe features  Plan: Admit to Labor and Delivery IOL CNM/MD to follow  Wynelle Bourgeois CNM, MSN Certified Nurse-Midwife 07/10/2019 3:42 AM

## 2019-07-10 NOTE — H&P (Addendum)
OB ADMISSION/ HISTORY & PHYSICAL:  Admission Date: 07/10/2019  2:22 AM  Admit Diagnosis: Gestational proteinuria  Mary Bray is a 29 y.o. female presenting for painful contractions that started last night. Denies leaking of fluid or vaginal bleeding. Endorses + fetal movement. Exam in MAU was closed/80/-2. Pt was seen on 07/06/19 for a preeclampsia evaluation. Pt had mild range BP, CBC and CMP WNL, and a PCR of 0.55. Pt was diagnosed with gestational proteinuria and scheduled to be induced at 40 weeks. Upon presentation today, pt has mild range BPs but denies headache, epigastric pain, or visual changes. Pt admitted for augmentation of labor per Dr. Charlotta Newton. FOB is involved but will not be attending labor or birth. States her friend will be here for support later today. Eagerly anticipating the arrival of baby boy, Mary Bray.   Prenatal History: G3P0020   EDC : 07/15/2019, Date entered prior to episode creation  Prenatal care at [facility] since 13 weeks   Prenatal course complicated by: 1. Herpes simplex type 1 infection  2. 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 Gestational proteinuria - Dx at 38 weeks, plan induction at 40 weeks. 4. Anemia of pregnancy - Hgb 10.7 at 28 weeks, recommend Fe 5. RhD negative - Rhogam at 30 weeks, prn 6. History of loop electrosurgical excision procedure   Prenatal Labs: ABO, Rh: O (11/20 0000)  Antibody: PENDING (06/11 0359) Rubella: Immune (11/20 0000)  RPR: Nonreactive (11/20 0000)  HBsAg: Negative (11/20 0000)  HIV: Non-reactive (11/20 0000)  GBS: Negative/-- (05/19 0000)  1 hr Glucola : 86 Genetic Screening: Low risk female Ultrasound: Growth on 06/17/19 [place]. Vertex. Anterior Left Placenta. Cervix not see per protocol. Amniotic fluid appears Normal. AFI= 18.1cm EFW=2880g. 6 pounds 6oz, 58%    Maternal Diabetes: No Genetic Screening: Normal Maternal Ultrasounds/Referrals: Normal Fetal Ultrasounds or other Referrals:  None Maternal  Substance Abuse:  No Significant Maternal Medications:  None Significant Maternal Lab Results:  Rh negative and Other: HSV + on Valtrex from 36 weeks Other Comments:  None  Medical / Surgical History :  Past medical history:  Past Medical History:  Diagnosis Date  . Abnormal Pap smear   . Anemia   . Herpes simplex virus type 1 (HSV-1) dermatitis   . High risk HPV infection   . HSV-2 (herpes simplex virus 2) infection   . Vaginal Pap smear, abnormal     Past surgical history:  Past Surgical History:  Procedure Laterality Date  . LEEP      Family History: History reviewed. No pertinent family history.   Social History:  reports that she has never smoked. She has never used smokeless tobacco. She reports previous alcohol use. She reports that she does not use drugs.  Allergies: Patient has no known allergies.   Current Medications at time of admission:  Medications Prior to Admission  Medication Sig Dispense Refill Last Dose  . acetaminophen (TYLENOL) 500 MG tablet Take 500 mg by mouth every 6 (six) hours as needed.     . Prenatal Vit-Fe Fumarate-FA (MULTIVITAMIN-PRENATAL) 27-0.8 MG TABS tablet Take 1 tablet by mouth daily at 12 noon.   07/09/2019 at Unknown time  . valACYclovir (VALTREX) 500 MG tablet Take 500 mg by mouth daily as needed (for sore outbreaks).    07/09/2019 at Unknown time  . cyclobenzaprine (FLEXERIL) 10 MG tablet Take 1 tablet (10 mg total) by mouth 2 (two) times daily as needed for muscle spasms. 20 tablet 0   .  Elastic Bandages & Supports (COMFORT FIT MATERNITY SUPP MED) MISC Wear belt during the day, as needed, and remove at night prior to bedtime. 1 each 0    Review of Systems: Review of Systems  Constitutional: Negative for chills and fever.  HENT: Negative for congestion and sore throat.   Eyes: Negative for blurred vision and photophobia.  Respiratory: Negative for cough and shortness of breath.   Cardiovascular: Negative for chest pain and leg  swelling.  Gastrointestinal: Positive for heartburn. Negative for nausea and vomiting.  Musculoskeletal: Negative for back pain.  Neurological: Negative for dizziness and headaches.  Psychiatric/Behavioral: Negative for depression. The patient is not nervous/anxious.    Physical Exam: Vital signs and nursing notes reviewed.  ED Triage Vitals  Enc Vitals Group     BP 07/10/19 0243 (!) 130/91     Pulse Rate 07/10/19 0243 94     Resp 07/10/19 0242 16     Temp 07/10/19 0242 98.3 F (36.8 C)     Temp src --      SpO2 --      Weight 07/10/19 0242 128 lb (58.1 kg)     Height 07/10/19 0242 5' (1.524 m)     Head Circumference --      Peak Flow --      Pain Score 07/10/19 0244 6     Pain Loc --      Pain Edu? --      Excl. in [place]? --     General: AAO x 3, NAD Heart: RRR Lungs:CTAB Abdomen: Gravid, NT Extremities: no edema Genitalia / VE: FT/80/-2 with bloody show  SSE: No lesions   FHR: 125BPM, mod variability, + accels, no decels TOCO: Ctx q 5-8 minutes  Labs:   Pending T&S, CBC, RPR  Recent Labs    07/10/19 0359  WBC 7.3  HGB 10.9*  HCT 35.1*  PLT 280   Assessment:  29 y.o. G3P0020 at [redacted]w[redacted]d  1. Gestational proteinuria 2. Early labor 3. Category 1 FHT 4. History of HSV, on Valtrex since 36 weeks 5. Rh negative  Plan:  1. Admit to BS 2. Routine L&D orders 3. Analgesia/anesthesia PRN  4. Recheck SVE in 4 hours for cervical change, will consider augmentation if no change  Dr. Nelda Marseille notified of admission / plan of care  [place]ryan, MSN 07/10/2019, 5:15 AM

## 2019-07-10 NOTE — Progress Notes (Signed)
Wynelle Bourgeois CNM in to see pt and review EFM.

## 2019-07-10 NOTE — Progress Notes (Signed)
Patient ID: Mary Bray, female   DOB: [birth year], 29 y.o.   MRN: [id number] S: Comfortable with epidural, position changes frequently.  Support in room, two female friends, SIL and baby's godmother. FOB on the phone with patient during provider encounter and patient visibly upset by conversation and crying.   O: Vitals:   07/10/19 1302 07/10/19 1305 07/10/19 1331 07/10/19 1401  BP: (!) 100/54 136/76 130/79 (!) 143/112  Pulse: (!) 111 74 96   Resp:      Temp:      TempSrc:      Weight:      Height:         FHT:  FHR: 120 bpm, variability: moderate,  accelerations:  Present,  decelerations:  Absent UC:   regular, every 2-3 minutes SVE:   Dilation: 7 Effacement (%): 90 Station: 0 Exam by:: Giovan Pinsky cnm + show  A / P: Spontaneous labor, progressing normally  Continue as established, expectant mgmt.   PEC w/o SF Stable BP, labile to mild range occasionally  Relationship discord - advised support people to minimize emotional stress, may aggravate BP and patient needs to focus on work of labor. Advised screening phone calls for now.   Fetal Wellbeing:  Category I Pain Control:  Epidural  Anticipated MOD:  NSVD  Neta Mends, CNM, MSN 07/10/2019, 2:44 PM

## 2019-07-10 NOTE — MAU Note (Signed)
Ctxs since 6mn. Denies VB or LOF. Cervix closed last sve

## 2019-07-10 NOTE — Anesthesia Procedure Notes (Signed)
Epidural Patient location during procedure: OB Start time: 07/10/2019 6:04 AM End time: 07/10/2019 6:13 AM  Staffing Anesthesiologist: Lannie Fields, DO Performed: anesthesiologist   Preanesthetic Checklist Completed: patient identified, IV checked, risks and benefits discussed, monitors and equipment checked, pre-op evaluation and timeout performed  Epidural Patient position: sitting Prep: DuraPrep and site prepped and draped Patient monitoring: continuous pulse ox, blood pressure, heart rate and cardiac monitor Approach: midline Location: L3-L4 Injection technique: LOR air  Needle:  Needle type: Tuohy  Needle gauge: 17 G Needle length: 9 cm Needle insertion depth: 4.5 cm Catheter type: closed end flexible Catheter size: 19 Gauge Catheter at skin depth: 10 cm Test dose: negative  Assessment Sensory level: T8 Events: blood not aspirated, injection not painful, no injection resistance, no paresthesia and negative IV test  Additional Notes Patient identified. Risks/Benefits/Options discussed with patient including but not limited to bleeding, infection, nerve damage, paralysis, failed block, incomplete pain control, headache, blood pressure changes, nausea, vomiting, reactions to medication both or allergic, itching and postpartum back pain. Confirmed with bedside nurse the patient's most recent platelet count. Confirmed with patient that they are not currently taking any anticoagulation, have any bleeding history or any family history of bleeding disorders. Patient expressed understanding and wished to proceed. All questions were answered. Sterile technique was used throughout the entire procedure. Please see nursing notes for vital signs. Test dose was given through epidural catheter and negative prior to continuing to dose epidural or start infusion. Warning signs of high block given to the patient including shortness of breath, tingling/numbness in hands, complete motor  block, or any concerning symptoms with instructions to call for help. Patient was given instructions on fall risk and not to get out of bed. All questions and concerns addressed with instructions to call with any issues or inadequate analgesia.  Reason for block:procedure for pain

## 2019-07-11 LAB — CBC
HCT: 33.9 % — ABNORMAL LOW (ref 36.0–46.0)
Hemoglobin: 10.6 g/dL — ABNORMAL LOW (ref 12.0–15.0)
MCH: 26.7 pg (ref 26.0–34.0)
MCHC: 31.3 g/dL (ref 30.0–36.0)
MCV: 85.4 fL (ref 80.0–100.0)
Platelets: 258 10*3/uL (ref 150–400)
RBC: 3.97 MIL/uL (ref 3.87–5.11)
RDW: 15.9 % — ABNORMAL HIGH (ref 11.5–15.5)
WBC: 27.9 10*3/uL — ABNORMAL HIGH (ref 4.0–10.5)
nRBC: 0 % (ref 0.0–0.2)

## 2019-07-11 MED ORDER — IBUPROFEN 600 MG PO TABS: 600.0000 mg | ORAL_TABLET | Freq: Four times a day (QID) | ORAL | 0 refills | Status: AC

## 2019-07-11 MED ORDER — RHO D IMMUNE GLOBULIN 1500 UNIT/2ML IJ SOSY
300.0000 ug | PREFILLED_SYRINGE | Freq: Once | INTRAMUSCULAR | Status: AC
  Administered 2019-07-11: 300 ug via INTRAVENOUS
  Filled 2019-07-11: qty 2

## 2019-07-11 MED ORDER — MEDROXYPROGESTERONE ACETATE 150 MG/ML IM SUSP
150.0000 mg | Freq: Once | INTRAMUSCULAR | Status: AC
  Administered 2019-07-12: 150 mg via INTRAMUSCULAR
  Filled 2019-07-11: qty 1

## 2019-07-11 NOTE — Anesthesia Postprocedure Evaluation (Signed)
Anesthesia Post Note  Patient: Mary Bray  Procedure(s) Performed: AN AD HOC LABOR EPIDURAL     Patient location during evaluation: Mother Baby Anesthesia Type: Epidural Level of consciousness: awake and alert and oriented Pain management: pain level controlled Vital Signs Assessment: post-procedure vital signs reviewed and stable Respiratory status: spontaneous breathing Cardiovascular status: stable Postop Assessment: no headache, adequate PO intake, no backache, patient able to bend at knees, able to ambulate, epidural receding and no apparent nausea or vomiting Anesthetic complications: no   No complications documented.  Last Vitals:  Vitals:   07/11/19 0155 07/11/19 0600  BP: (!) 104/57 124/80  Pulse: 84 73  Resp: 18 18  Temp: 36.6 C 36.4 C    Last Pain:  Vitals:   07/11/19 0737  TempSrc:   PainSc: 0-No pain   Pain Goal: Patients Stated Pain Goal: 4 (07/10/19 1700)              Epidural/Spinal Function Cutaneous sensation: Normal sensation (07/11/19 0737), Patient able to flex knees: Yes (07/11/19 0737), Patient able to lift hips off bed: Yes (07/11/19 0737), Back pain beyond tenderness at insertion site: No (07/11/19 0737), Progressively worsening motor and/or sensory loss: No (07/11/19 0737), Bowel and/or bladder incontinence post epidural: No (07/11/19 0737)  Salome Arnt

## 2019-07-11 NOTE — Discharge Summary (Addendum)
SVD OB Discharge Summary     Patient Name: Mary Bray DOB: 09-25-1990 MRN: [id number]  Date of admission: 07/10/2019 Delivering MD: Juliene Pina  Date of delivery: 07/10/2019 Type of delivery: SVD  Newborn Data: Sex: Baby female Circumcision: In pt desired.  Live born female  Birth Weight: 6 lb 13 oz (3090 g) APGAR: 8, 9  Newborn Delivery   Birth date/time: 07/10/2019 18:35:00 Delivery type: Vaginal, Spontaneous      Feeding: bottle Infant being discharge to home with mother in stable condition.   Admitting diagnosis: Gestational hypertension [O13.9] Intrauterine pregnancy: [redacted]w[redacted]d     Secondary diagnosis:  Principal Problem:   Normal postpartum course Active Problems:   Preeclampsia w/o SF   SVD (spontaneous vaginal delivery)   Rh negative, maternal                                Complications: None                                                              Intrapartum Procedures: spontaneous vaginal delivery and fever with no tx.  Postpartum Procedures: Rho(D) Ig Complications-Operative and Postpartum: none Augmentation: N/A   History of Present Illness: Mary Bray is a 29 y.o. female, G3P0020, who presents at [redacted]w[redacted]d weeks gestation. The patient has been followed at  [facility]necology  Her pregnancy has been complicated by:  Patient Active Problem List   Diagnosis Date Noted  . Preeclampsia w/o SF 07/10/2019  . SVD (spontaneous vaginal delivery) 07/10/2019  . Normal postpartum course 07/10/2019  . Rh negative, maternal 07/10/2019  . CIN II (cervical intraepithelial neoplasia II) 03/14/2011    Hospital course:  Onset of Labor With Vaginal Delivery      29 y.o. yo G3P0020 at [redacted]w[redacted]d was admitted in Latent Labor on 07/10/2019. Patient had an uncomplicated labor course as follows:  Membrane Rupture Time/Date: 6:15 AM ,07/10/2019   Delivery Method:Vaginal, Spontaneous  Episiotomy: None  Lacerations:  None  Patient had an  uncomplicated postpartum course.  She is ambulating, tolerating a regular diet, passing flatus, and urinating well. Patient is discharged home in stable condition on 07/11/19.  Newborn Data: Birth date:07/10/2019  Birth time:6:35 PM  Gender:Female  Living status:Living  Apgars:8 ,9  Weight:3090 g  Postpartum Day # 1 : S/P NSVD due to spontaneous latent labor. Patient up ad lib, denies syncope or dizziness. Reports consuming regular diet without issues and denies N/V. Patient reports 0 bowel movement + passing flatus.  Denies issues with urination and reports bleeding is "lighter."  Patient is bottle feeding and reports going well.  Desires depo prior to be discharged then patch at 6 weeks PP for postpartum contraception.  Pain is being appropriately managed with use of po meds. Maternal fever noted at 1900 on 6/11 of 100.6, pt was no treated, afebrile since, recommended pt staying in house to be monitored for a fever for until tomorrow, pt aware to check temp and aware of R/B/A of leaving, pt verbalized she understood and desired to leave any ways. Pt also bening monitored for PreE without SF< no meds given, BP now 124/80, denies HA, RUQ pain or vision changes, pt to f/u in  [facility] within 5 days for BP check, pt verbalized understanding sand will report AH, RUQ pain or vision changes.   Physical exam  Vitals:   07/10/19 2155 07/11/19 0155 07/11/19 0600 07/11/19 1013  BP: 115/67 (!) 104/57 124/80 123/85  Pulse: (!) 115 84 73 76  Resp: 18 18 18 18   Temp: 98.5 F (36.9 C) 97.8 F (36.6 C) 97.6 F (36.4 C) 98.1 F (36.7 C)  TempSrc: Oral Oral Oral Oral  Weight:      Height:       General: alert, cooperative and no distress Lochia: appropriate Uterine Fundus: firm Perineum: Intact DVT Evaluation: No evidence of DVT seen on physical exam. Negative Homan's sign. No cords or calf tenderness. No significant calf/ankle edema.  Labs: Lab Results  Component Value Date   WBC 27.9 (H) 07/11/2019    HGB 10.6 (L) 07/11/2019   HCT 33.9 (L) 07/11/2019   MCV 85.4 07/11/2019   PLT 258 07/11/2019   CMP Latest Ref Rng & Units 07/10/2019  Glucose 70 - 99 mg/dL 09/09/2019)  BUN 6 - 20 mg/dL 7  Creatinine 854(O - 2.70 mg/dL 3.50  Sodium 0.93 - 818 mmol/L 138  Potassium 3.5 - 5.1 mmol/L 3.4(L)  Chloride 98 - 111 mmol/L 105  CO2 22 - 32 mmol/L 22  Calcium 8.9 - 10.3 mg/dL 9.3  Total Protein 6.5 - 8.1 g/dL 6.3(L)  Total Bilirubin 0.3 - 1.2 mg/dL 0.4  Alkaline Phos 38 - 126 U/L 368(H)  AST 15 - 41 U/L 24  ALT 0 - 44 U/L 15    Date of discharge: 07/11/2019 Discharge Diagnoses: Term Pregnancy-delivered Discharge instruction: per After Visit Summary and "Baby and Me Booklet".  After visit meds:   Activity:           unrestricted and pelvic rest Advance as tolerated. Pelvic rest for 6 weeks.  Diet:                routine Medications: PNV and Ibuprofen Postpartum contraception: Depo Provera Condition:  Pt discharge to home with baby in stable  Meds: Allergies as of 07/11/2019   No Known Allergies     Medication List    STOP taking these medications   Comfort Fit Maternity Supp Med Misc   valACYclovir 500 MG tablet Commonly known as: VALTREX     TAKE these medications   ibuprofen 600 MG tablet Commonly known as: ADVIL Take 1 tablet (600 mg total) by mouth every 6 (six) hours.   multivitamin-prenatal 27-0.8 MG Tabs tablet Take 1 tablet by mouth daily at 12 noon.       Discharge Follow Up:   Follow-up Information    [facility] Follow up.   Specialty: Obstetrics and Gynecology Why: 1 week PPV for BP check and 6 week PPV Contact information: [address] [place] [phone]               [place], NP-C, CNM 07/11/2019, 11:20 AM  09/10/2019, FNP    Addendum: 4:33 PM Discharged cancelled due to peds keeping the baby.

## 2019-07-12 ENCOUNTER — Encounter (HOSPITAL_COMMUNITY): Payer: Self-pay | Admitting: *Deleted

## 2019-07-12 LAB — RH IG WORKUP (INCLUDES ABO/RH)
ABO/RH(D): O NEG
Fetal Screen: NEGATIVE
Gestational Age(Wks): 39.2
Unit division: 0

## 2019-07-12 NOTE — Discharge Summary (Addendum)
SVD OB Discharge Summary     Patient Name: Mary Bray DOB: 12/13/1990 MRN: [id number]  Date of admission: 07/10/2019 Delivering MD: Juliene Pina  Date of delivery: 07/10/2019 Type of delivery: SVD  Newborn Data: Sex: Baby female Circumcision: In pt desired.  Live born female  Birth Weight: 6 lb 13 oz (3090 g) APGAR: 8, 9  Newborn Delivery   Birth date/time: 07/10/2019 18:35:00 Delivery type: Vaginal, Spontaneous      Feeding: bottle Infant being discharge to home with mother in stable condition.   Admitting diagnosis: Gestational hypertension [O13.9] Intrauterine pregnancy: [redacted]w[redacted]d     Secondary diagnosis:  Principal Problem:   Normal postpartum course Active Problems:   Preeclampsia w/o SF   SVD (spontaneous vaginal delivery)   Rh negative, maternal                                Complications: None                                                              Intrapartum Procedures: spontaneous vaginal delivery and fever with no tx.  Postpartum Procedures: Rho(D) Ig Complications-Operative and Postpartum: none Augmentation: N/A   History of Present Illness: Ms. Mary Bray is a 29 y.o. female, G3P0020, who presents at [redacted]w[redacted]d weeks gestation. The patient has been followed at  [facility]  Her pregnancy has been complicated by:  Patient Active Problem List   Diagnosis Date Noted  . Preeclampsia w/o SF 07/10/2019  . SVD (spontaneous vaginal delivery) 07/10/2019  . Normal postpartum course 07/10/2019  . Rh negative, maternal 07/10/2019  . CIN II (cervical intraepithelial neoplasia II) 03/14/2011    Hospital course:  Onset of Labor With Vaginal Delivery      29 y.o. yo G3P0020 at [redacted]w[redacted]d was admitted in Latent Labor on 07/10/2019. Patient had an uncomplicated labor course as follows:  Membrane Rupture Time/Date: 6:15 AM ,07/10/2019   Delivery Method:Vaginal, Spontaneous  Episiotomy: None  Lacerations:  None  Patient had an  uncomplicated postpartum course.  She is ambulating, tolerating a regular diet, passing flatus, and urinating well. Patient is discharged home in stable condition on 07/12/19.  Newborn Data: Birth date:07/10/2019  Birth time:6:35 PM  Gender:Female  Living status:Living  Apgars:8 ,9  Weight:3090 g  Postpartum Day # 2 : S/P NSVD due to spontaneous latent labor. Patient up ad lib, denies syncope or dizziness. Reports consuming regular diet without issues and denies N/V. Patient reports 0 bowel movement + passing flatus.  Denies issues with urination and reports bleeding is "lighter."  Patient is bottle feeding and reports going well.  Recieved depo prior to be discharged then patch at 6 weeks PP for postpartum contraception.  Pain is being appropriately managed with use of po meds. Maternal fever noted at 1900 on 6/11 of 100.6, pt was no treated, afebrile since. Pt also bening monitored for PreE without SF, no meds given, BP now 113/81, denies HA, RUQ pain or vision changes, pt to f/u in [facility] within 5 days for BP check, pt verbalized understanding sand will report AH, RUQ pain or vision changes.   Physical exam  Vitals:   07/11/19 1013 07/11/19 1530 07/11/19 2027 07/12/19 0546  BP: 123/85 92/64 136/84 113/81  Pulse: 76 86 73 67  Resp: 18 18 18 18   Temp: 98.1 F (36.7 C) 97.7 F (36.5 C) 98.1 F (36.7 C) 97.6 F (36.4 C)  TempSrc: Oral Oral Oral Oral  SpO2:   100% 100%  Weight:      Height:       General: alert, cooperative and no distress Lochia: appropriate Uterine Fundus: firm Perineum: Intact DVT Evaluation: No evidence of DVT seen on physical exam. Negative Homan's sign. No cords or calf tenderness. No significant calf/ankle edema.  Labs: Lab Results  Component Value Date   WBC 27.9 (H) 07/11/2019   HGB 10.6 (L) 07/11/2019   HCT 33.9 (L) 07/11/2019   MCV 85.4 07/11/2019   PLT 258 07/11/2019   CMP Latest Ref Rng & Units 07/10/2019  Glucose 70 - 99 mg/dL 09/09/2019)  BUN 6 - 20  mg/dL 7  Creatinine 353(G - 9.92 mg/dL 4.26  Sodium 8.34 - 196 mmol/L 138  Potassium 3.5 - 5.1 mmol/L 3.4(L)  Chloride 98 - 111 mmol/L 105  CO2 22 - 32 mmol/L 22  Calcium 8.9 - 10.3 mg/dL 9.3  Total Protein 6.5 - 8.1 g/dL 6.3(L)  Total Bilirubin 0.3 - 1.2 mg/dL 0.4  Alkaline Phos 38 - 126 U/L 368(H)  AST 15 - 41 U/L 24  ALT 0 - 44 U/L 15    Date of discharge: 07/12/2019 Discharge Diagnoses: Term Pregnancy-delivered Discharge instruction: per After Visit Summary and "Baby and Me Booklet".  After visit meds:   Activity:           unrestricted and pelvic rest Advance as tolerated. Pelvic rest for 6 weeks.  Diet:                routine Medications: PNV and Ibuprofen Postpartum contraception: Depo Provera Condition:  Pt discharge to home with baby in stable  Meds: Allergies as of 07/12/2019   No Known Allergies     Medication List    STOP taking these medications   Comfort Fit Maternity Supp Med Misc   valACYclovir 500 MG tablet Commonly known as: VALTREX     TAKE these medications   ibuprofen 600 MG tablet Commonly known as: ADVIL Take 1 tablet (600 mg total) by mouth every 6 (six) hours.   multivitamin-prenatal 27-0.8 MG Tabs tablet Take 1 tablet by mouth daily at 12 noon.       Discharge Follow Up:   Follow-up Information    [facility] Follow up.   Specialty: Obstetrics and Gynecology Why: 1 week PPV for BP check and 6 week PPV Contact information: [address] [place] [phone]               [place], NP-C, CNM 07/12/2019, 7:27 AM  07/14/2019, FNP

## 2019-07-13 ENCOUNTER — Other Ambulatory Visit (HOSPITAL_COMMUNITY): Payer: BC Managed Care – PPO

## 2019-07-14 LAB — SURGICAL PATHOLOGY

## 2019-07-15 ENCOUNTER — Inpatient Hospital Stay (HOSPITAL_COMMUNITY)
Admission: AD | Admit: 2019-07-15 | Payer: BC Managed Care – PPO | Source: Home / Self Care | Admitting: Obstetrics & Gynecology

## 2019-07-15 ENCOUNTER — Inpatient Hospital Stay (HOSPITAL_COMMUNITY): Payer: BC Managed Care – PPO
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
## 2022-02-01 IMAGING — US US MFM FETAL BPP W/O NON-STRESS
1 series · 12 of 26 positions shown · non-contrast
Comparison: none

[Series 1: us mfm fetal bpp w/o non-stress · 26 acquisitions, 12 frames shown]
[im 2/26]
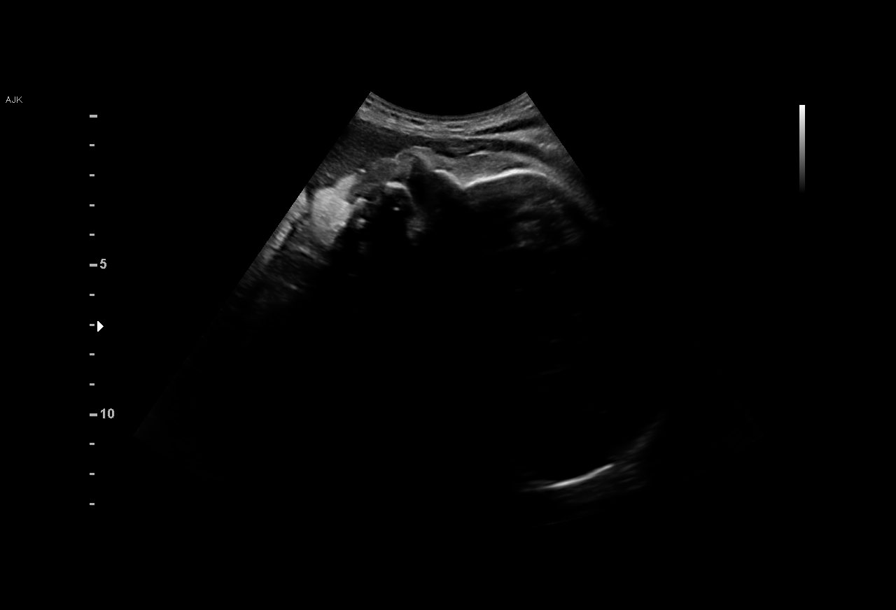
[im 4/26]
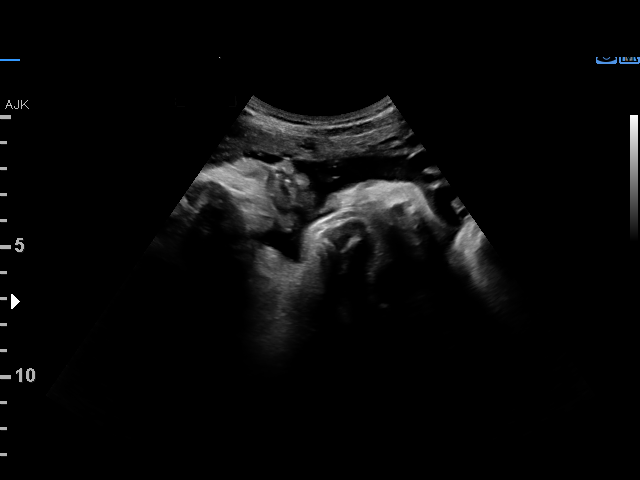
[im 6/26]
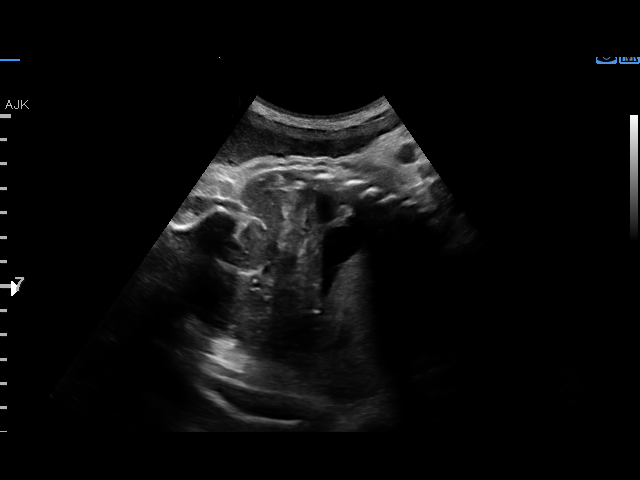
[im 8/26]
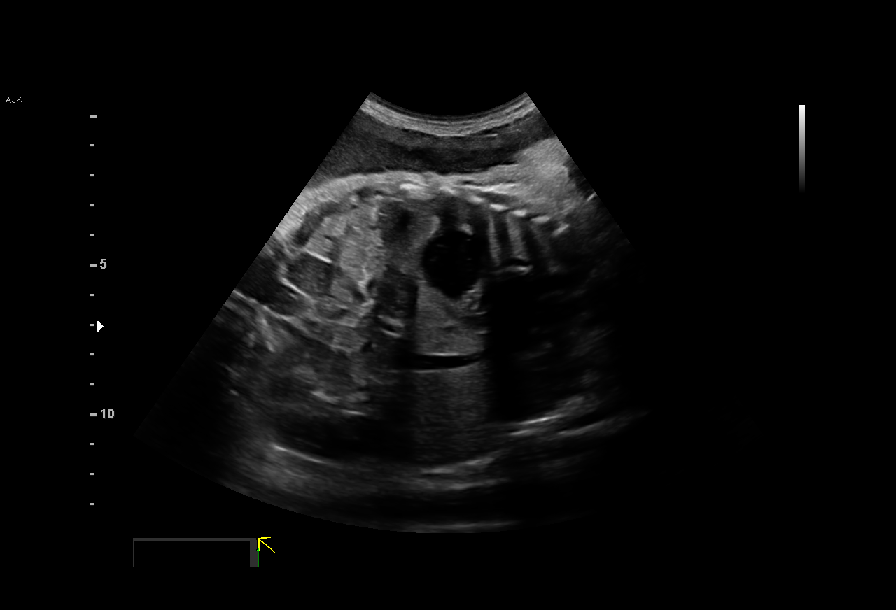
[im 10/26]
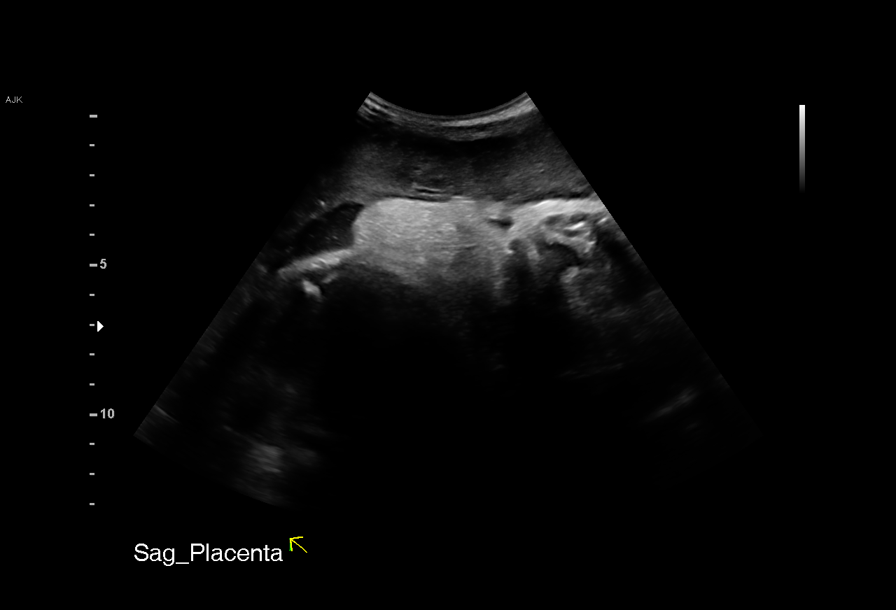
[im 12/26]
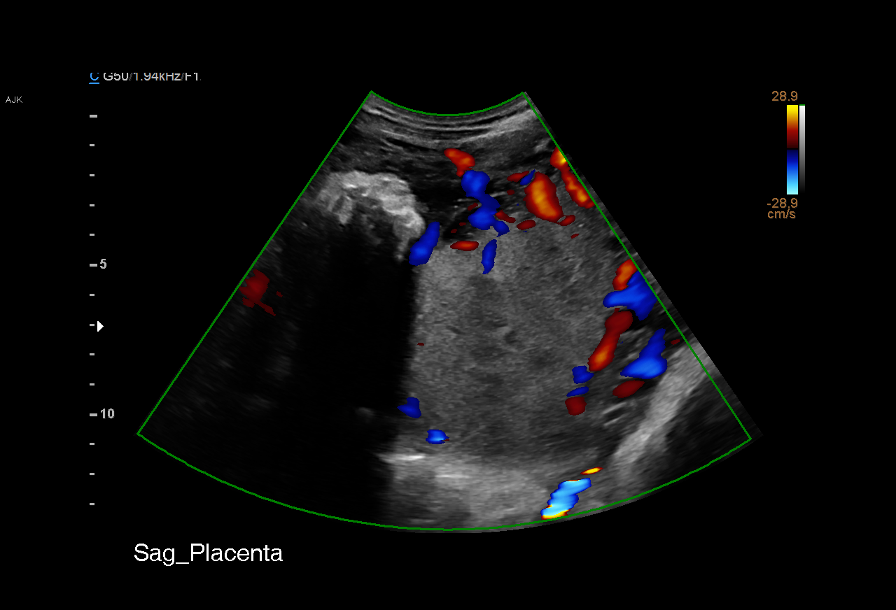
[im 15/26]
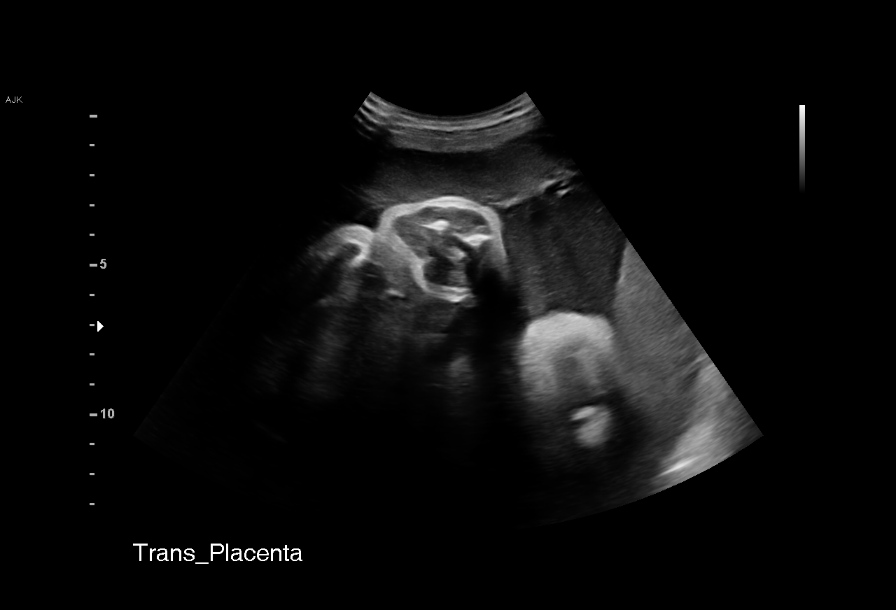
[im 17/26]
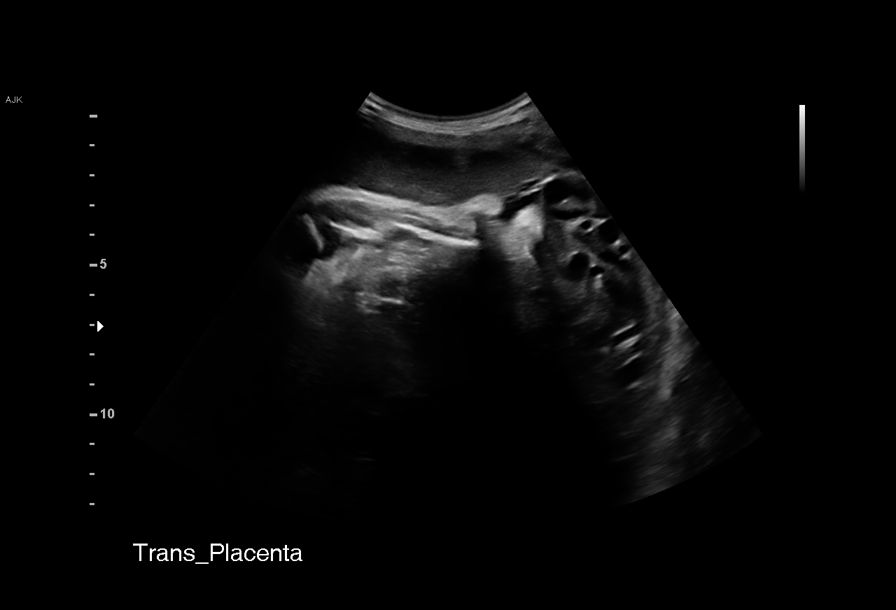
[im 19/26]
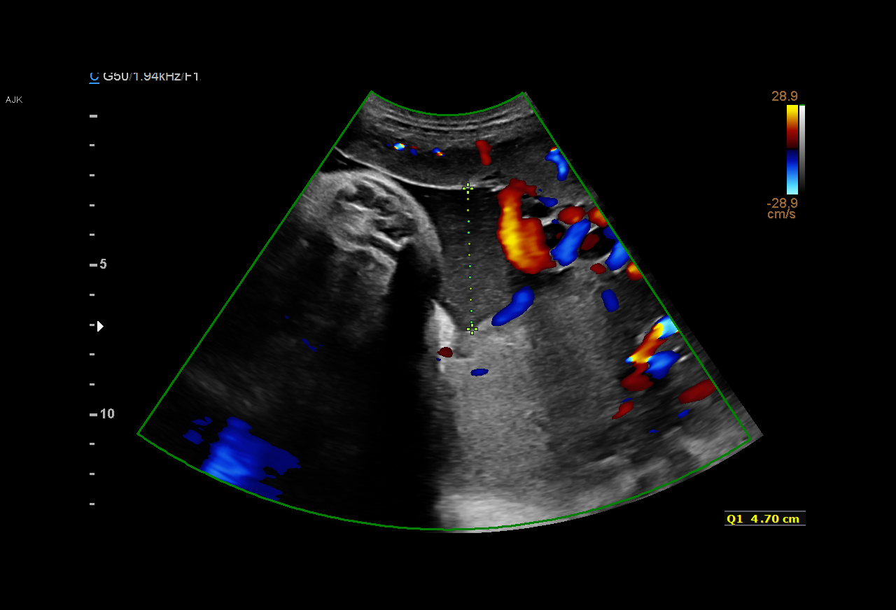
[im 21/26]
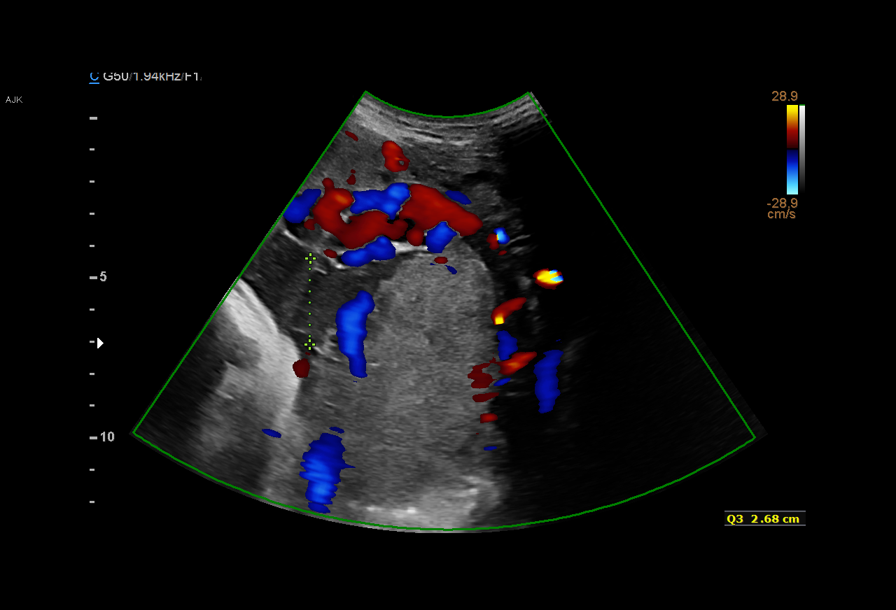
[im 23/26]
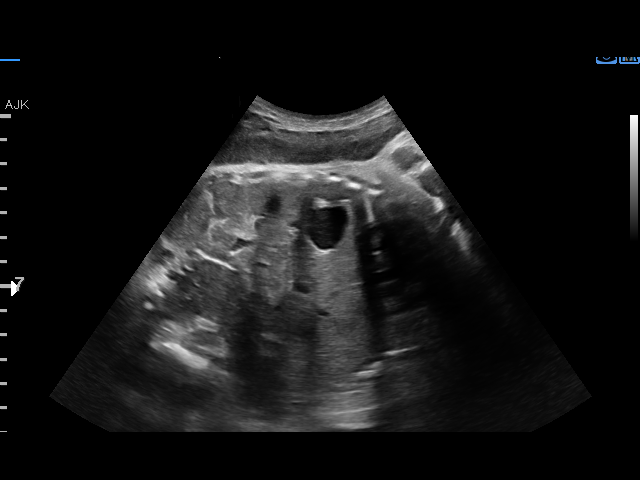
[im 25/26]
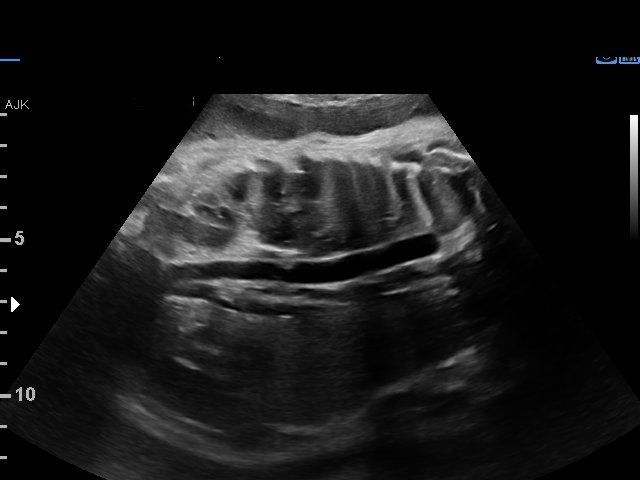

[12 of 26 positions shown; findings below may reference images not displayed]

Indications

 38 weeks gestation of pregnancy
 Non-reactive NST
Fetal Evaluation

 Num Of Fetuses:         1
 Fetal Heart Rate(bpm):  129
 Cardiac Activity:       Observed
 Presentation:           Cephalic
 Placenta:               Left lateral

 Amniotic Fluid
 AFI FV:      Within normal limits

 AFI Sum(cm)     %Tile       Largest Pocket(cm)
 11.2            37

 RUQ(cm)       RLQ(cm)       LUQ(cm)        LLQ(cm)
 4.7           0.8           3

 Comment:    Stomach, bladder, and diaphragm seen.
Biophysical Evaluation

 Amniotic F.V:   Within normal limits       F. Tone:        Observed
 F. Movement:    Observed                   Score:          [DATE]
 F. Breathing:   Observed
OB History

 Gravidity:    3         Term:   1
 TOP:          1
Gestational Age
 Clinical EDD:  38w 5d                                        EDD:   07/15/19
 Best:          38w 5d     Det. By:  Clinical EDD             EDD:   07/15/19
Impression

 BPP was requested for nonreactive NST.

 Antenatal testing is reassuring. BPP [DATE].
                 Oleary, Sarrah

## 2022-12-19 ENCOUNTER — Other Ambulatory Visit: Payer: Self-pay | Admitting: Obstetrics and Gynecology

## 2022-12-21 LAB — SURGICAL PATHOLOGY
